# Patient Record
Sex: Male | Born: 1975
Health system: Southern US, Community
[De-identification: ages and names within clinical notes are randomized; demographics above are authoritative.]

## PROBLEM LIST (undated history)

## (undated) DIAGNOSIS — J45909 Unspecified asthma, uncomplicated: Secondary | ICD-10-CM

## (undated) DIAGNOSIS — N529 Male erectile dysfunction, unspecified: Secondary | ICD-10-CM

## (undated) DIAGNOSIS — E291 Testicular hypofunction: Secondary | ICD-10-CM

## (undated) HISTORY — DX: Testicular hypofunction: E29.1

## (undated) HISTORY — DX: Unspecified asthma, uncomplicated: J45.909

## (undated) HISTORY — DX: Male erectile dysfunction, unspecified: N52.9

---
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
## 2014-08-06 ENCOUNTER — Other Ambulatory Visit: Payer: Self-pay

## 2014-08-06 NOTE — Telephone Encounter (Signed)
Patient was last seen on 01/06/14 with a follow-up as needed. [facility] is [facility] [address] number is [phone].

## 2014-08-06 NOTE — Telephone Encounter (Signed)
Patient must be seen for this medicine Schedule appointment to see Malachy Mood for this please

## 2014-08-07 NOTE — Telephone Encounter (Signed)
Called and scheduled patient an appointment for Friday August 22, 2014 for med refill.

## 2014-08-12 ENCOUNTER — Telehealth: Payer: Self-pay | Admitting: Unknown Physician Specialty

## 2014-08-12 NOTE — Telephone Encounter (Signed)
E-Fax came through for refill: Rx: ALPRAZolam (XANAX) 0.5 MG tablet Copy in basket

## 2014-08-12 NOTE — Telephone Encounter (Signed)
Routing to provider. It seems like I just put in a refill request for this patient and medication within the last few days but I do not see in epic where anything has been done with it yet.

## 2014-08-13 NOTE — Telephone Encounter (Signed)
Steven Rowland is back tomorrow- OK to leave for her.

## 2014-08-14 MED ORDER — ALPRAZOLAM 0.5 MG PO TABS: 0.5000 mg | ORAL_TABLET | ORAL | Status: DC | PRN

## 2014-08-14 NOTE — Addendum Note (Signed)
Addended by: Kathrine Haddock on: 08/14/2014 08:52 AM   Modules accepted: Orders

## 2014-08-14 NOTE — Telephone Encounter (Signed)
Written but pt will need to pick up

## 2014-08-20 DIAGNOSIS — J45990 Exercise induced bronchospasm: Secondary | ICD-10-CM | POA: Insufficient documentation

## 2014-08-20 DIAGNOSIS — N529 Male erectile dysfunction, unspecified: Secondary | ICD-10-CM | POA: Insufficient documentation

## 2014-08-20 DIAGNOSIS — E291 Testicular hypofunction: Secondary | ICD-10-CM | POA: Insufficient documentation

## 2014-08-22 ENCOUNTER — Encounter: Payer: Self-pay | Admitting: Unknown Physician Specialty

## 2014-08-22 ENCOUNTER — Ambulatory Visit (INDEPENDENT_AMBULATORY_CARE_PROVIDER_SITE_OTHER): Payer: BLUE CROSS/BLUE SHIELD | Admitting: Unknown Physician Specialty

## 2014-08-22 VITALS — BP 126/83 | HR 57 | Temp 97.8°F | Ht 70.5 in | Wt 167.8 lb

## 2014-08-22 DIAGNOSIS — E291 Testicular hypofunction: Secondary | ICD-10-CM | POA: Diagnosis not present

## 2014-08-22 DIAGNOSIS — G4700 Insomnia, unspecified: Secondary | ICD-10-CM

## 2014-08-22 DIAGNOSIS — N529 Male erectile dysfunction, unspecified: Secondary | ICD-10-CM | POA: Diagnosis not present

## 2014-08-22 MED ORDER — TADALAFIL 20 MG PO TABS: 20.0000 mg | ORAL_TABLET | Freq: Every day | ORAL | Status: DC | PRN

## 2014-08-22 NOTE — Assessment & Plan Note (Signed)
Due to shift changes.  OK for Xanax twice a week

## 2014-08-22 NOTE — Assessment & Plan Note (Signed)
Through lifestyle

## 2014-08-22 NOTE — Assessment & Plan Note (Signed)
Rx for Cialis 

## 2014-08-22 NOTE — Progress Notes (Signed)
f  BP 126/83 mmHg  Pulse 57  Temp(Src) 97.8 F (36.6 C)  Ht 5' 10.5" (1.791 m)  Wt 167 lb 12.8 oz (76.114 kg)  BMI 23.73 kg/m2  SpO2 97%   Subjective:    Patient ID: Steven Rowland, male    DOB: [birth year], 39 y.o.   MRN: [id number]  HPI: Steven Rowland is a 39 y.o. male  Chief Complaint  Patient presents with  . Medication Refill    Pt needs refills on xanax and cialis   Anxiety Pt is taking Xanax about twice a week.  Pt finds that he needs this for shift changes.  This has been a long term medication without any changes in dosing.    ED Pt is off the Testosterone since going to the gym and getting fit.  Takes Cialis as he is often "under pressure" due to attempts to have a 4th child.    Relevant past medical, surgical, family and social history reviewed and updated as indicated. Interim medical history since our last visit reviewed. Allergies and medications reviewed and updated.  Review of Systems  Constitutional: Negative.   HENT: Negative.   Eyes: Negative.   Respiratory: Negative.   Cardiovascular: Negative.   Gastrointestinal: Negative.   Endocrine: Negative.   Genitourinary: Negative.   Skin: Negative.   Allergic/Immunologic: Negative.   Neurological: Negative.   Hematological: Negative.   Psychiatric/Behavioral: Negative.     Per HPI unless specifically indicated above     Objective:    BP 126/83 mmHg  Pulse 57  Temp(Src) 97.8 F (36.6 C)  Ht 5' 10.5" (1.791 m)  Wt 167 lb 12.8 oz (76.114 kg)  BMI 23.73 kg/m2  SpO2 97%  Wt Readings from Last 3 Encounters:  08/22/14 167 lb 12.8 oz (76.114 kg)  01/06/14 163 lb (73.936 kg)    Physical Exam  Constitutional: He is oriented to person, place, and time. He appears well-developed and well-nourished. No distress.  HENT:  Head: Normocephalic and atraumatic.  Eyes: Conjunctivae and lids are normal. Right eye exhibits no discharge. Left eye exhibits no discharge. No scleral icterus.  Cardiovascular:  Normal rate and regular rhythm.   Pulmonary/Chest: Effort normal and breath sounds normal. No respiratory distress.  Abdominal: Normal appearance. There is no splenomegaly or hepatomegaly.  Musculoskeletal: Normal range of motion.  Neurological: He is alert and oriented to person, place, and time.  Skin: Skin is intact. No rash noted. No pallor.  Psychiatric: He has a normal mood and affect. His behavior is normal. Judgment and thought content normal.    No results found for this or any previous visit.    Assessment & Plan:   Problem List Items Addressed This Visit      Unprioritized   Hypogonadism in male    Through lifestyle      ED (erectile dysfunction) - Primary    Rx for Cialis      Insomnia    Due to shift changes.  OK for Xanax twice a week          Follow up plan: Return in about 6 months (around 02/22/2015) for physical.

## 2015-02-24 ENCOUNTER — Encounter: Payer: Self-pay | Admitting: Unknown Physician Specialty

## 2015-02-24 ENCOUNTER — Ambulatory Visit (INDEPENDENT_AMBULATORY_CARE_PROVIDER_SITE_OTHER): Payer: BLUE CROSS/BLUE SHIELD | Admitting: Unknown Physician Specialty

## 2015-02-24 VITALS — BP 119/80 | HR 60 | Temp 97.4°F | Ht 69.5 in | Wt 172.2 lb

## 2015-02-24 DIAGNOSIS — G4700 Insomnia, unspecified: Secondary | ICD-10-CM | POA: Diagnosis not present

## 2015-02-24 DIAGNOSIS — E291 Testicular hypofunction: Secondary | ICD-10-CM | POA: Diagnosis not present

## 2015-02-24 DIAGNOSIS — Z0000 Encounter for general adult medical examination without abnormal findings: Secondary | ICD-10-CM | POA: Diagnosis not present

## 2015-02-24 MED ORDER — ALPRAZOLAM 0.5 MG PO TABS: 0.5000 mg | ORAL_TABLET | ORAL | Status: DC | PRN

## 2015-02-24 NOTE — Assessment & Plan Note (Signed)
Check levels.  Consider restarting after getting levels back.  Pt did best with injectables.

## 2015-02-24 NOTE — Assessment & Plan Note (Addendum)
Continue Alprazolam prn that he takes on nights he closes in the evening

## 2015-02-24 NOTE — Progress Notes (Signed)
BP 119/80 mmHg  Pulse 60  Temp(Src) 97.4 F (36.3 C)  Ht 5' 9.5" (1.765 m)  Wt 172 lb 3.2 oz (78.109 kg)  BMI 25.07 kg/m2  SpO2 97%   Subjective:    Patient ID: Steven Rowland, male    DOB: 06-20-1975, 40 y.o.   MRN: [id number]  HPI: Steven Rowland is a 40 y.o. male  Chief Complaint  Patient presents with  . Annual Exam    pt states he would like to start back on testosterone therapy   Hypogonadism History of hypogonadism.  He was on 300 mg every 2 weeks. Last follow up showed high levels and he stopped. Pt states he has no interest in sex.    Insomnia  Stable with Clonazepam.    Relevant past medical, surgical, family and social history reviewed and updated as indicated. Interim medical history since our last visit reviewed. Allergies and medications reviewed and updated.  Review of Systems  Constitutional: Negative.   HENT: Negative.   Eyes: Negative.   Respiratory: Negative.   Cardiovascular: Negative.   Gastrointestinal: Negative.   Endocrine: Negative.   Genitourinary: Negative.   Musculoskeletal:       H[place] and seeing Dr. Rogers Blocker.    Skin: Negative.   Allergic/Immunologic: Negative.   Neurological: Negative.   Hematological: Negative.   Psychiatric/Behavioral: Negative.     Per HPI unless specifically indicated above     Objective:    BP 119/80 mmHg  Pulse 60  Temp(Src) 97.4 F (36.3 C)  Ht 5' 9.5" (1.765 m)  Wt 172 lb 3.2 oz (78.109 kg)  BMI 25.07 kg/m2  SpO2 97%  Wt Readings from Last 3 Encounters:  02/24/15 172 lb 3.2 oz (78.109 kg)  08/22/14 167 lb 12.8 oz (76.114 kg)  01/06/14 163 lb (73.936 kg)    Physical Exam  Constitutional: He is oriented to person, place, and time. He appears well-developed and well-nourished.  HENT:  Head: Normocephalic.  Right Ear: Tympanic membrane, external ear and ear canal normal.  Left Ear: Tympanic membrane, external ear and ear canal normal.  Mouth/Throat: Uvula  is midline, oropharynx is clear and moist and mucous membranes are normal.  Eyes: Pupils are equal, round, and reactive to light.  Cardiovascular: Normal rate, regular rhythm and normal heart sounds.  Exam reveals no gallop and no friction rub.   No murmur heard. Pulmonary/Chest: Effort normal and breath sounds normal. No respiratory distress.  Abdominal: Soft. Bowel sounds are normal. He exhibits no distension. There is no tenderness.  Musculoskeletal: Normal range of motion.  Neurological: He is alert and oriented to person, place, and time. He has normal reflexes.  Skin: Skin is warm and dry.  Psychiatric: He has a normal mood and affect. His behavior is normal. Judgment and thought content normal.    No results found for this or any previous visit.    Assessment & Plan:   Problem List Items Addressed This Visit      Unprioritized   Hypogonadism in male    Check levels.  Consider restarting after getting levels back.  Pt did best with injectables.        Relevant Orders   PSA   Testosterone, Free, Total, SHBG   FSH   Prolactin   Insomnia    Continue Alprazolam prn that he takes on nights he closes in the evening       Other Visit Diagnoses    Routine general medical examination at  a health care facility    -  Primary    Relevant Orders    CBC    Lipid Panel w/o Chol/HDL Ratio    TSH    Comprehensive metabolic panel    HIV antibody        Follow up plan: Return in about 1 year (around 02/24/2016).

## 2015-02-25 ENCOUNTER — Other Ambulatory Visit: Payer: BLUE CROSS/BLUE SHIELD

## 2015-02-25 DIAGNOSIS — E291 Testicular hypofunction: Secondary | ICD-10-CM

## 2015-02-25 DIAGNOSIS — Z0000 Encounter for general adult medical examination without abnormal findings: Secondary | ICD-10-CM

## 2015-02-26 ENCOUNTER — Other Ambulatory Visit: Payer: Self-pay | Admitting: Unknown Physician Specialty

## 2015-02-26 ENCOUNTER — Other Ambulatory Visit: Payer: BLUE CROSS/BLUE SHIELD

## 2015-02-26 ENCOUNTER — Encounter: Payer: Self-pay | Admitting: Unknown Physician Specialty

## 2015-02-26 DIAGNOSIS — E291 Testicular hypofunction: Secondary | ICD-10-CM

## 2015-02-26 LAB — CBC
Hematocrit: 46.3 % (ref 37.5–51.0)
Hemoglobin: 16.2 g/dL (ref 12.6–17.7)
MCH: 29.7 pg (ref 26.6–33.0)
MCHC: 35 g/dL (ref 31.5–35.7)
MCV: 85 fL (ref 79–97)
PLATELETS: 236 10*3/uL (ref 150–379)
RBC: 5.46 x10E6/uL (ref 4.14–5.80)
RDW: 12.7 % (ref 12.3–15.4)
WBC: 5.1 10*3/uL (ref 3.4–10.8)

## 2015-02-26 LAB — COMPREHENSIVE METABOLIC PANEL
ALT: 32 IU/L (ref 0–44)
AST: 27 IU/L (ref 0–40)
Albumin/Globulin Ratio: 1.9 (ref 1.1–2.5)
Albumin: 4.5 g/dL (ref 3.5–5.5)
Alkaline Phosphatase: 56 IU/L (ref 39–117)
BUN/Creatinine Ratio: 22 — ABNORMAL HIGH (ref 8–19)
BUN: 22 mg/dL — AB (ref 6–20)
Bilirubin Total: 0.6 mg/dL (ref 0.0–1.2)
CALCIUM: 9.5 mg/dL (ref 8.7–10.2)
CO2: 25 mmol/L (ref 18–29)
Chloride: 101 mmol/L (ref 96–106)
Creatinine, Ser: 1.02 mg/dL (ref 0.76–1.27)
GFR, EST AFRICAN AMERICAN: 107 mL/min/{1.73_m2} (ref >59)
GFR, EST NON AFRICAN AMERICAN: 93 mL/min/{1.73_m2} (ref >59)
GLUCOSE: 80 mg/dL (ref 65–99)
Globulin, Total: 2.4 g/dL (ref 1.5–4.5)
Potassium: 4.5 mmol/L (ref 3.5–5.2)
Sodium: 139 mmol/L (ref 134–144)
TOTAL PROTEIN: 6.9 g/dL (ref 6.0–8.5)

## 2015-02-26 LAB — HIV ANTIBODY (ROUTINE TESTING W REFLEX): HIV Screen 4th Generation wRfx: NONREACTIVE

## 2015-02-26 LAB — FOLLICLE STIMULATING HORMONE: FSH: 2.2 m[IU]/mL (ref 1.5–12.4)

## 2015-02-26 LAB — TESTOSTERONE, FREE, TOTAL, SHBG
Sex Hormone Binding: 30.2 nmol/L (ref 16.5–55.9)
TESTOSTERONE FREE: 10 pg/mL (ref 8.7–25.1)
TESTOSTERONE: 332 ng/dL — AB (ref 348–1197)

## 2015-02-26 LAB — PSA: PROSTATE SPECIFIC AG, SERUM: 0.5 ng/mL (ref 0.0–4.0)

## 2015-02-26 LAB — LIPID PANEL W/O CHOL/HDL RATIO
Cholesterol, Total: 138 mg/dL (ref 100–199)
HDL: 41 mg/dL (ref >39)
LDL Calculated: 87 mg/dL (ref 0–99)
Triglycerides: 49 mg/dL (ref 0–149)
VLDL Cholesterol Cal: 10 mg/dL (ref 5–40)

## 2015-02-26 LAB — TSH: TSH: 1.72 u[IU]/mL (ref 0.450–4.500)

## 2015-02-26 LAB — PROLACTIN: Prolactin: 4 ng/mL (ref 4.0–15.2)

## 2015-03-04 ENCOUNTER — Encounter: Payer: Self-pay | Admitting: Urology

## 2015-03-04 ENCOUNTER — Other Ambulatory Visit: Payer: Self-pay

## 2015-03-04 ENCOUNTER — Ambulatory Visit (INDEPENDENT_AMBULATORY_CARE_PROVIDER_SITE_OTHER): Payer: BLUE CROSS/BLUE SHIELD | Admitting: Urology

## 2015-03-04 VITALS — BP 151/92 | HR 56 | Ht 70.0 in | Wt 176.9 lb

## 2015-03-04 DIAGNOSIS — E291 Testicular hypofunction: Secondary | ICD-10-CM

## 2015-03-04 DIAGNOSIS — F5221 Male erectile disorder: Secondary | ICD-10-CM

## 2015-03-04 NOTE — Progress Notes (Signed)
03/04/2015 11:09 AM   Steven Rowland [birth year] [id number]  Referring provider: Kathrine Haddock, NP [place], [place] [address]    HPI: Patient is a 40 year old Caucasian male with a history of hypogonadism who is referred by his PCP, Kathrine Haddock, NP to restart his testosterone therapy.    Patient had been injecting himself with testosterone cypionate for three years for hypogonadism.  He had been on topical gels prior to that.  He discontinued the gels due to transference issues with his small children.  He states he has a 9 year history of hypogonadism.  He had an undescended testicle in the 80's for which he underwent a successful orchiopexy.  He discontinued the injections in order to conceive another child.  They were unsuccessful, but he is having severe hypogonadal symptoms, such as loss of libido, depression, erections not as firm and falling asleep after dinner.  He would like to get back on some type of therapy.        Androgen Deficiency in the Aging Male      03/04/15 1000       Androgen Deficiency in the Aging Male   Do you have a decrease in libido (sex drive) Yes     Do you have lack of energy No     Do you have a decrease in strength and/or endurance No     Have you lost height No     Have you noticed a decreased "enjoyment of life" Yes     Are you sad and/or grumpy No     Are your erections less strong Yes     Have you noticed a recent deterioration in your ability to play sports No     Are you falling asleep after dinner Yes     Has there been a recent deterioration in your work performance No          When he was off his testosterone therapy, he was using PDE5-inhibitors to achieve erections.  When he is on testosterone therapy, his erections are strong and he has spontaneous erections as well.  His SHIM score today is 16, mild to moderate ED.  He does not have painful erections or curvature with his erections.        SHIM      03/04/15 1030       SHIM: Over the last 6 months:   How do you rate your confidence that you could get and keep an erection? Low     When you had erections with sexual stimulation, how often were your erections hard enough for penetration (entering your partner)? Sometimes (about half the time)     During sexual intercourse, how often were you able to maintain your erection after you had penetrated (entered) your partner? Slightly Difficult     During sexual intercourse, how difficult was it to maintain your erection to completion of intercourse? Slightly Difficult     When you attempted sexual intercourse, how often was it satisfactory for you? Difficult     SHIM Total Score   SHIM 16        Score: 1-7 Severe ED 8-11 Moderate ED 12-16 Mild-Moderate ED 17-21 Mild ED 22-25 No ED  He does not have any difficulty with urination.  His IPSS score 0/0.        IPSS      03/04/15 1000       International Prostate Symptom Score   How often have you  had the sensation of not emptying your bladder? Not at All     How often have you had to urinate less than every two hours? Not at All     How often have you found you stopped and started again several times when you urinated? Not at All     How often have you found it difficult to postpone urination? Not at All     How often have you had a weak urinary stream? Not at All     How often have you had to strain to start urination? Not at All     How many times did you typically get up at night to urinate? None     Total IPSS Score 0     Quality of Life due to urinary symptoms   If you were to spend the rest of your life with your urinary condition just the way it is now how would you feel about that? Delighted        Score:  1-7 Mild 8-19 Moderate 20-35 Severe  PMH: Past Medical History  Diagnosis Date  . Asthma   . Hypogonadism in male   . ED (erectile dysfunction)     Surgical History: Past Surgical History  Procedure Laterality Date  . Surgery  scrotal / testicular  1982    undescended testicle    Home Medications:    Medication List       This list is accurate as of: 03/04/15 11:09 AM.  Always use your most recent med list.               ALPRAZolam 0.5 MG tablet  Commonly known as:  XANAX  Take 1 tablet (0.5 mg total) by mouth as needed for anxiety. Take 1 tablet by mouth once daily as needed     aluminum chloride 20 % external solution  Commonly known as:  DRYSOL  Apply topically at bedtime. Reported on 03/04/2015     fluticasone 50 MCG/ACT nasal spray  Commonly known as:  FLONASE  Place 2 sprays into both nostrils daily. Reported on 03/04/2015        Allergies: No Known Allergies  Family History: Family History  Problem Relation Age of Onset  . Arthritis Mother   . Stroke Father   . Hyperlipidemia Father   . Hypertension Father   . Asthma Brother   . Thyroid disease Brother   . Eczema Son   . Cancer Maternal Grandmother   . Cancer Maternal Grandfather   . Kidney Stones Father   . Kidney disease Neg Hx   . Prostate cancer Neg Hx     Social History:  reports that he has never smoked. He has never used smokeless tobacco. He reports that he drinks about 0.6 - 1.2 oz of alcohol per week. He reports that he does not use illicit drugs.  ROS: UROLOGY Frequent Urination?: No Hard to postpone urination?: No Burning/pain with urination?: No Get up at night to urinate?: No Leakage of urine?: No Urine stream starts and stops?: No Trouble starting stream?: No Do you have to strain to urinate?: No Blood in urine?: No Urinary tract infection?: No Sexually transmitted disease?: No Injury to kidneys or bladder?: No Painful intercourse?: No Weak stream?: No Erection problems?: Yes Penile pain?: No  Gastrointestinal Nausea?: No Vomiting?: No Indigestion/heartburn?: No Diarrhea?: Yes Constipation?: No  Constitutional Fever: No Night sweats?: No Weight loss?: No Fatigue?: No  Skin Skin  rash/lesions?: No Itching?: No  Eyes  Blurred vision?: No Double vision?: No  Ears/Nose/Throat Sore throat?: No Sinus problems?: No  Hematologic/Lymphatic Swollen glands?: No Easy bruising?: No  Cardiovascular Leg swelling?: No Chest pain?: No  Respiratory Cough?: No Shortness of breath?: No  Endocrine Excessive thirst?: No  Musculoskeletal Back pain?: No Joint pain?: No  Neurological Headaches?: No Dizziness?: No  Psychologic Depression?: No Anxiety?: No  Physical Exam: BP 151/92 mmHg  Pulse 56  Ht 5\' 10"  (1.778 m)  Wt 176 lb 14.4 oz (80.241 kg)  BMI 25.38 kg/m2  Constitutional: Well nourished. Alert and oriented, No acute distress. HEENT: [place] AT, moist mucus membranes. Trachea midline, no masses. Cardiovascular: No clubbing, cyanosis, or edema. Respiratory: Normal respiratory effort, no increased work of breathing. GI: Abdomen is soft, non tender, non distended, no abdominal masses. Liver and spleen not palpable.  No hernias appreciated.  Stool sample for occult testing is not indicated.   GU: No CVA tenderness.  No bladder fullness or masses.  Patient with circumcised phallus.   Urethral meatus is patent.  No penile discharge. No penile lesions or rashes. Scrotum without lesions, cysts, rashes and/or edema.  Testicles are located scrotally bilaterally. No masses are appreciated in the testicles. Left and right epididymis are normal. Rectal: Patient with  normal sphincter tone. Anus and perineum without scarring or rashes. No rectal masses are appreciated. Prostate is approximately 35 grams, no nodules are appreciated. Seminal vesicles are normal. Skin: No rashes, bruises or suspicious lesions. Lymph: No cervical or inguinal adenopathy. Neurologic: Grossly intact, no focal deficits, moving all 4 extremities. Psychiatric: Normal mood and affect.  Laboratory Data: Lab Results  Component Value Date   WBC 5.1 02/25/2015   HCT 46.3 02/25/2015   MCV 85  02/25/2015   PLT 236 02/25/2015    Lab Results  Component Value Date   CREATININE 1.02 02/25/2015    Lab Results  Component Value Date   TESTOSTERONE 332* 02/25/2015    Lab Results  Component Value Date   TSH 1.720 02/25/2015    Lab Results  Component Value Date   AST 27 02/25/2015   Lab Results  Component Value Date   ALT 32 02/25/2015     Assessment & Plan:    1. Hypogonadism:   I explained to patient that hypogonadism is diagnosed after 2 morning serum testosterone draws before 9 AM on 2 separate occasions returned below the laboratories parameter for normal testosterone. He is in need of one more lab draw.    I discussed with the patient the side effects of testosterone therapy, such as: enlargement of the prostate gland that may in turn cause LUTS, possible increased risk of PCa, DVT's and/or PE's, possible increased risk of heart attack or stroke, lower sperm count, swelling of the ankles, feet, or body, with or without heart failure, enlarged or painful breasts, have problems breathing while you sleep (sleep apnea), increased prostate specific antigen, mood swings, hypertension and increased red blood cell count.  I also discussed that some men have had success using clomid for hypogonadism.  It does seem to be more successful in younger men, but there are incidences of good results in middle-aged men.  I explained that it is used in male infertility to stimulate the testicles to make more testosterone/sperm.  There has been no long term data on side effects, but some urologists has been having success with this medication.   He will return in the morning for a second testosterone draw before 9 AM.  He is interested  in starting the Clomid, if he is found to be hypogonadal.    2. Erectile dysfunction:   His SHIM score is 16.  He states his ED is not present when he is on testosterone therapy.  We will readdress once his testosterone values are at therapeutic levels.      Return for return tomorrow for an am testosterone draw.  These notes generated with voice recognition software. I apologize for typographical errors.  Zara Council, [facility] [address], [place], [place] [address] [phone]

## 2015-03-05 ENCOUNTER — Other Ambulatory Visit: Payer: BLUE CROSS/BLUE SHIELD

## 2015-03-05 DIAGNOSIS — E291 Testicular hypofunction: Secondary | ICD-10-CM

## 2015-03-06 ENCOUNTER — Telehealth: Payer: Self-pay

## 2015-03-06 ENCOUNTER — Ambulatory Visit: Payer: BLUE CROSS/BLUE SHIELD | Admitting: Obstetrics and Gynecology

## 2015-03-06 DIAGNOSIS — F5221 Male erectile disorder: Secondary | ICD-10-CM | POA: Insufficient documentation

## 2015-03-06 DIAGNOSIS — E291 Testicular hypofunction: Secondary | ICD-10-CM

## 2015-03-06 LAB — TESTOSTERONE: Testosterone: 296 ng/dL — ABNORMAL LOW (ref 348–1197)

## 2015-03-06 MED ORDER — CLOMIPHENE CITRATE 50 MG PO TABS: ORAL_TABLET | ORAL | Status: DC

## 2015-03-06 NOTE — Telephone Encounter (Signed)
-----   Message from Nori Riis, PA-C sent at 03/06/2015  8:24 AM EST ----- Patient's testosterone level is low.  He would like to try Clomid 50 mg, 1/2 tablet daily, # 30 with 3 refills.  We need to check his testosterone again in the morning in one month.

## 2015-03-06 NOTE — Telephone Encounter (Signed)
Spoke with pt in reference to testosterone. Pt agreed to clomid. Medication sent to pharmacy. Pt will RTC 04/02/15 for testosterone draw. Labs ordered.

## 2015-03-10 ENCOUNTER — Telehealth: Payer: Self-pay | Admitting: *Deleted

## 2015-03-10 NOTE — Telephone Encounter (Signed)
Spoke with patient regarding his Testopel approval. Patient is approved but has a 6000.00 deductible, so the insurance would not pay anything until that has been met, after that it would pay 25 %. Patient states he thinks the clomid is working and he will continue this at this time.

## 2015-03-24 ENCOUNTER — Telehealth: Payer: Self-pay | Admitting: Unknown Physician Specialty

## 2015-03-24 MED ORDER — SCOPOLAMINE 1 MG/3DAYS TD PT72: 1.0000 | MEDICATED_PATCH | TRANSDERMAL | Status: DC

## 2015-03-24 NOTE — Telephone Encounter (Signed)
Routing to provider  

## 2015-03-24 NOTE — Telephone Encounter (Signed)
Pt called and stated he is going on a 6 day cruise and he would like motion sickness patches. Send to [facility] [place].

## 2015-04-02 ENCOUNTER — Other Ambulatory Visit: Payer: BLUE CROSS/BLUE SHIELD

## 2015-04-02 DIAGNOSIS — E291 Testicular hypofunction: Secondary | ICD-10-CM

## 2015-04-03 LAB — TESTOSTERONE: Testosterone: 516 ng/dL (ref 348–1197)

## 2015-04-17 ENCOUNTER — Telehealth: Payer: Self-pay

## 2015-04-17 NOTE — Telephone Encounter (Signed)
LMOM-labs are great. Continue medication. Will need labs again in 3 mo.

## 2015-04-17 NOTE — Telephone Encounter (Signed)
-----   Message from Nori Riis, PA-C sent at 04/15/2015  9:16 AM EDT ----- Patient's testosterone level is great.  I would suggest continuing with the Clomid.  I will need a morning testosterone before 9 AM with a HCT in 3 months.

## 2015-10-14 ENCOUNTER — Other Ambulatory Visit: Payer: Self-pay | Admitting: Unknown Physician Specialty

## 2015-10-15 NOTE — Telephone Encounter (Signed)
Your patient 

## 2015-11-11 ENCOUNTER — Ambulatory Visit (INDEPENDENT_AMBULATORY_CARE_PROVIDER_SITE_OTHER): Payer: BLUE CROSS/BLUE SHIELD | Admitting: Unknown Physician Specialty

## 2015-11-11 ENCOUNTER — Encounter: Payer: Self-pay | Admitting: Unknown Physician Specialty

## 2015-11-11 VITALS — BP 126/86 | HR 61 | Temp 97.7°F | Wt 179.0 lb

## 2015-11-11 DIAGNOSIS — J0111 Acute recurrent frontal sinusitis: Secondary | ICD-10-CM

## 2015-11-11 DIAGNOSIS — J45909 Unspecified asthma, uncomplicated: Secondary | ICD-10-CM

## 2015-11-11 MED ORDER — AMOXICILLIN-POT CLAVULANATE 875-125 MG PO TABS: 1.0000 | ORAL_TABLET | Freq: Two times a day (BID) | ORAL | 0 refills | Status: DC

## 2015-11-11 MED ORDER — ALBUTEROL SULFATE HFA 108 (90 BASE) MCG/ACT IN AERS: 2.0000 | INHALATION_SPRAY | Freq: Four times a day (QID) | RESPIRATORY_TRACT | 2 refills | Status: DC | PRN

## 2015-11-11 MED ORDER — BENZONATATE 200 MG PO CAPS: 200.0000 mg | ORAL_CAPSULE | Freq: Two times a day (BID) | ORAL | 0 refills | Status: DC | PRN

## 2015-11-11 MED ORDER — FLUTICASONE PROPIONATE 50 MCG/ACT NA SUSP: 2.0000 | Freq: Every day | NASAL | 3 refills | Status: DC

## 2015-11-11 NOTE — Progress Notes (Signed)
BP 126/86 (BP Location: Left Arm, Patient Position: Sitting, Cuff Size: Large)   Pulse 61   Temp 97.7 F (36.5 C)   Wt 179 lb (81.2 kg)   SpO2 98%   BMI 25.68 kg/m    Subjective:    Patient ID: Steven Rowland, male    DOB: 05-Feb-1975, 40 y.o.   MRN: [id number]  HPI: Steven Rowland is a 40 y.o. male  Chief Complaint  Patient presents with  . URI    pt states he has a cough and chest congestion that started about 2 weeks ago. States it started in his head and has moved to his chest.    URI   This is a new problem. Episode onset: 2 weeks. The problem has been gradually worsening. There has been no fever. Associated symptoms include congestion, coughing and wheezing. Associated symptoms comments: SOB . He has tried decongestant and antihistamine for the symptoms.    Relevant past medical, surgical, family and social history reviewed and updated as indicated. Interim medical history since our last visit reviewed. Allergies and medications reviewed and updated.  Review of Systems  HENT: Positive for congestion.   Respiratory: Positive for cough and wheezing.     Per HPI unless specifically indicated above     Objective:    BP 126/86 (BP Location: Left Arm, Patient Position: Sitting, Cuff Size: Large)   Pulse 61   Temp 97.7 F (36.5 C)   Wt 179 lb (81.2 kg)   SpO2 98%   BMI 25.68 kg/m   Wt Readings from Last 3 Encounters:  11/11/15 179 lb (81.2 kg)  03/04/15 176 lb 14.4 oz (80.2 kg)  02/24/15 172 lb 3.2 oz (78.1 kg)    Physical Exam  Constitutional: He is oriented to person, place, and time. He appears well-developed and well-nourished. No distress.  HENT:  Head: Normocephalic and atraumatic.  Right Ear: Tympanic membrane and ear canal normal.  Left Ear: Tympanic membrane and ear canal normal.  Nose: Rhinorrhea present. Right sinus exhibits no maxillary sinus tenderness and no frontal sinus tenderness. Left sinus exhibits no maxillary sinus tenderness and no  frontal sinus tenderness.  Mouth/Throat: Uvula is midline. Posterior oropharyngeal edema present.  Eyes: Conjunctivae and lids are normal. Right eye exhibits no discharge. Left eye exhibits no discharge. No scleral icterus.  Neck: Normal range of motion. Neck supple. No JVD present. Carotid bruit is not present.  Cardiovascular: Normal rate, regular rhythm and normal heart sounds.   Pulmonary/Chest: Effort normal and breath sounds normal. No respiratory distress.  Abdominal: Normal appearance. There is no splenomegaly or hepatomegaly.  Musculoskeletal: Normal range of motion.  Neurological: He is alert and oriented to person, place, and time.  Skin: Skin is warm, dry and intact. No rash noted. No pallor.  Psychiatric: He has a normal mood and affect. His behavior is normal. Judgment and thought content normal.  Nursing note and vitals reviewed.   Results for orders placed or performed in visit on 04/02/15  Testosterone  Result Value Ref Range   Testosterone 516 348 - 1,197 ng/dL   Comment, Testosterone Comment       Assessment & Plan:   Problem List Items Addressed This Visit    None    Visit Diagnoses    Acute recurrent frontal sinusitis    -  Primary   Relevant Medications   amoxicillin-clavulanate (AUGMENTIN) 875-125 MG tablet   fluticasone (FLONASE) 50 MCG/ACT nasal spray   benzonatate (TESSALON) 200 MG capsule  Mild reactive airways disease, unspecified whether persistent           Follow up plan: Return if symptoms worsen or fail to improve.

## 2015-12-08 ENCOUNTER — Encounter: Payer: Self-pay | Admitting: Unknown Physician Specialty

## 2015-12-08 ENCOUNTER — Ambulatory Visit (INDEPENDENT_AMBULATORY_CARE_PROVIDER_SITE_OTHER): Payer: BLUE CROSS/BLUE SHIELD | Admitting: Unknown Physician Specialty

## 2015-12-08 VITALS — BP 148/89 | HR 91 | Temp 102.8°F | Wt 181.8 lb

## 2015-12-08 DIAGNOSIS — R509 Fever, unspecified: Secondary | ICD-10-CM | POA: Diagnosis not present

## 2015-12-08 DIAGNOSIS — J020 Streptococcal pharyngitis: Secondary | ICD-10-CM

## 2015-12-08 DIAGNOSIS — J029 Acute pharyngitis, unspecified: Secondary | ICD-10-CM

## 2015-12-08 LAB — VERITOR FLU A/B WAIVED
INFLUENZA B: NEGATIVE
Influenza A: NEGATIVE

## 2015-12-08 LAB — RAPID STREP SCREEN (MED CTR MEBANE ONLY): Strep Gp A Ag, IA W/Reflex: POSITIVE — AB

## 2015-12-08 MED ORDER — AMOXICILLIN 875 MG PO TABS: 875.0000 mg | ORAL_TABLET | Freq: Two times a day (BID) | ORAL | 0 refills | Status: DC

## 2015-12-08 NOTE — Progress Notes (Signed)
BP (!) 148/89 (BP Location: Left Arm, Patient Position: Sitting, Cuff Size: Normal)   Pulse 91   Temp (!) 102.8 F (39.3 C)   Wt 181 lb 12.8 oz (82.5 kg)   SpO2 97%   BMI 26.09 kg/m    Subjective:    Patient ID: Steven Rowland, male    DOB: 02/20/1975, 40 y.o.   MRN: [id number]  HPI: Brentyn Skoog is a 40 y.o. male  Chief Complaint  Patient presents with  . URI    pt states he has a sore throat, chills, fever, weakness, and body aches. States symptoms started yesterday.    URI   This is a new problem. The current episode started yesterday. The problem has been gradually worsening. The maximum temperature recorded prior to his arrival was 102 - 102.9 F. Associated symptoms include coughing and a sore throat. He has tried NSAIDs for the symptoms. The treatment provided mild relief.    Relevant past medical, surgical, family and social history reviewed and updated as indicated. Interim medical history since our last visit reviewed. Allergies and medications reviewed and updated.  Review of Systems  HENT: Positive for sore throat.   Respiratory: Positive for cough.     Per HPI unless specifically indicated above     Objective:    BP (!) 148/89 (BP Location: Left Arm, Patient Position: Sitting, Cuff Size: Normal)   Pulse 91   Temp (!) 102.8 F (39.3 C)   Wt 181 lb 12.8 oz (82.5 kg)   SpO2 97%   BMI 26.09 kg/m   Wt Readings from Last 3 Encounters:  12/08/15 181 lb 12.8 oz (82.5 kg)  11/11/15 179 lb (81.2 kg)  03/04/15 176 lb 14.4 oz (80.2 kg)    Physical Exam  Constitutional: He is oriented to person, place, and time. He appears well-developed and well-nourished. No distress.  HENT:  Head: Normocephalic and atraumatic.  Right Ear: Tympanic membrane and ear canal normal.  Left Ear: Tympanic membrane and ear canal normal.  Nose: Rhinorrhea present. Right sinus exhibits no maxillary sinus tenderness and no frontal sinus tenderness. Left sinus exhibits no  maxillary sinus tenderness and no frontal sinus tenderness.  Mouth/Throat: Uvula is midline. Oropharyngeal exudate and posterior oropharyngeal edema present. No tonsillar abscesses.  Eyes: Conjunctivae and lids are normal. Right eye exhibits no discharge. Left eye exhibits no discharge. No scleral icterus.  Neck: Neck supple.  Cardiovascular: Normal rate, regular rhythm and normal heart sounds.   Pulmonary/Chest: Effort normal and breath sounds normal. No respiratory distress.  Abdominal: Normal appearance. There is no splenomegaly or hepatomegaly.  Musculoskeletal: Normal range of motion.  Neurological: He is alert and oriented to person, place, and time.  Skin: Skin is warm, dry and intact. No rash noted. No pallor.  Psychiatric: He has a normal mood and affect. His behavior is normal. Judgment and thought content normal.  Nursing note and vitals reviewed.   Results for orders placed or performed in visit on 04/02/15  Testosterone  Result Value Ref Range   Testosterone 516 348 - 1,197 ng/dL   Comment, Testosterone Comment       Assessment & Plan:   Problem List Items Addressed This Visit    None    Visit Diagnoses    Fever, unspecified fever cause    -  Primary   Relevant Orders   Veritor Flu A/B Waived   Sore throat       Relevant Orders   Rapid strep screen (not at  [facility])   Strep pharyngitis       Rx for Amoxicillin       Follow up plan: Return if symptoms worsen or fail to improve.

## 2016-02-04 ENCOUNTER — Other Ambulatory Visit: Payer: Self-pay | Admitting: Urology

## 2016-02-04 DIAGNOSIS — E291 Testicular hypofunction: Secondary | ICD-10-CM

## 2016-02-05 NOTE — Telephone Encounter (Signed)
Pt pharmacy sent a refill request of clomid. Medication was denied. Pt needs labs and OV prior to refill. Spoke with pt and made aware of needing labs and OV. Pt voiced understanding. Lab appt made with orders placed. Pt stated he will make an appt at lab visit.

## 2016-02-10 ENCOUNTER — Other Ambulatory Visit: Payer: Self-pay

## 2016-02-10 ENCOUNTER — Encounter: Payer: Self-pay | Admitting: Urology

## 2016-02-26 ENCOUNTER — Encounter: Payer: BLUE CROSS/BLUE SHIELD | Admitting: Unknown Physician Specialty

## 2016-03-16 ENCOUNTER — Encounter: Payer: Self-pay | Admitting: Unknown Physician Specialty

## 2016-03-16 ENCOUNTER — Telehealth: Payer: Self-pay | Admitting: Unknown Physician Specialty

## 2016-03-16 ENCOUNTER — Ambulatory Visit (INDEPENDENT_AMBULATORY_CARE_PROVIDER_SITE_OTHER): Payer: BLUE CROSS/BLUE SHIELD | Admitting: Unknown Physician Specialty

## 2016-03-16 VITALS — BP 133/82 | HR 61 | Temp 97.9°F | Ht 69.2 in | Wt 176.7 lb

## 2016-03-16 DIAGNOSIS — E291 Testicular hypofunction: Secondary | ICD-10-CM | POA: Diagnosis not present

## 2016-03-16 DIAGNOSIS — Z0000 Encounter for general adult medical examination without abnormal findings: Secondary | ICD-10-CM

## 2016-03-16 DIAGNOSIS — F5101 Primary insomnia: Secondary | ICD-10-CM

## 2016-03-16 NOTE — Assessment & Plan Note (Signed)
Controlled with present meds

## 2016-03-16 NOTE — Assessment & Plan Note (Signed)
F/u with urology.  If there is a protocol, I can follow

## 2016-03-16 NOTE — Progress Notes (Signed)
   BP 133/82 (BP Location: Left Arm, Cuff Size: Large)   Pulse 61   Temp 97.9 F (36.6 C)   Ht 5' 9.2" (1.758 m)   Wt 176 lb 11.2 oz (80.2 kg)   SpO2 100%   BMI 25.94 kg/m    Subjective:    Patient ID: Steven Rowland, male    DOB: 09/22/1975, 41 y.o.   MRN: [id number]  HPI: Steven Rowland is a 41 y.o. male  Chief Complaint  Patient presents with  . 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  He still has refills left.    Hypogonadism Taking Clomid but out for the last month.  States it is working "okay."  Relevant past medical, surgical, family and social history reviewed and updated as indicated. Interim medical history since our last visit reviewed. Allergies and medications reviewed and updated.  Review of Systems  Constitutional: Negative.   HENT: Negative.   Eyes: Negative.   Respiratory: Negative.   Cardiovascular: Negative.   Gastrointestinal: Negative.   Endocrine: Negative.   Genitourinary: Negative.   Skin: Negative.   Allergic/Immunologic: Negative.   Neurological: Negative.   Hematological: Negative.   Psychiatric/Behavioral: Negative.     Per HPI unless specifically indicated above     Objective:    BP 133/82 (BP Location: Left Arm, Cuff Size: Large)   Pulse 61   Temp 97.9 F (36.6 C)   Ht 5' 9.2" (1.758 m)   Wt 176 lb 11.2 oz (80.2 kg)   SpO2 100%   BMI 25.94 kg/m   Wt Readings from Last 3 Encounters:  03/16/16 176 lb 11.2 oz (80.2 kg)  12/08/15 181 lb 12.8 oz (82.5 kg)  11/11/15 179 lb (81.2 kg)    Physical Exam  Constitutional: He is oriented to person, place, and time. He appears well-developed and well-nourished.  HENT:  Head: Normocephalic.  Right Ear: Tympanic membrane, external ear and ear canal normal.  Left Ear: Tympanic membrane, external ear and ear canal normal.  Mouth/Throat: Uvula is midline, oropharynx is clear and moist and mucous membranes are normal.  Eyes: Pupils are  equal, round, and reactive to light.  Cardiovascular: Normal rate, regular rhythm and normal heart sounds.  Exam reveals no gallop and no friction rub.   No murmur heard. Pulmonary/Chest: Effort normal and breath sounds normal. No respiratory distress.  Abdominal: Soft. Bowel sounds are normal. He exhibits no distension. There is no tenderness.  Musculoskeletal: Normal range of motion.  Neurological: He is alert and oriented to person, place, and time. He has normal reflexes.  Skin: Skin is warm and dry.  Psychiatric: He has a normal mood and affect. His behavior is normal. Judgment and thought content normal.     Assessment & Plan:   Problem List Items Addressed This Visit      Unprioritized   Hypogonadism in male    F/u with urology.  If there is a protocol, I can follow      Insomnia    Controlled with present meds       Other Visit Diagnoses    Routine general medical examination at a health care facility    -  Primary   Relevant Orders   Comprehensive metabolic panel   CBC with Differential/Platelet   Lipid Panel w/o Chol/HDL Ratio   TSH       Follow up plan: Return in about 1 year (around 03/16/2017).

## 2016-03-17 LAB — CBC WITH DIFFERENTIAL/PLATELET
Basophils Absolute: 0 10*3/uL (ref 0.0–0.2)
Basos: 1 %
EOS (ABSOLUTE): 0.2 10*3/uL (ref 0.0–0.4)
EOS: 4 %
HEMATOCRIT: 43.4 % (ref 37.5–51.0)
HEMOGLOBIN: 15.2 g/dL (ref 13.0–17.7)
Immature Grans (Abs): 0 10*3/uL (ref 0.0–0.1)
Immature Granulocytes: 0 %
LYMPHS ABS: 1.3 10*3/uL (ref 0.7–3.1)
LYMPHS: 34 %
MCH: 29.2 pg (ref 26.6–33.0)
MCHC: 35 g/dL (ref 31.5–35.7)
MCV: 83 fL (ref 79–97)
MONOCYTES: 9 %
Monocytes Absolute: 0.4 10*3/uL (ref 0.1–0.9)
NEUTROS PCT: 52 %
Neutrophils Absolute: 2.1 10*3/uL (ref 1.4–7.0)
Platelets: 192 10*3/uL (ref 150–379)
RBC: 5.21 x10E6/uL (ref 4.14–5.80)
RDW: 13 % (ref 12.3–15.4)
WBC: 4 10*3/uL (ref 3.4–10.8)

## 2016-03-17 LAB — COMPREHENSIVE METABOLIC PANEL
ALBUMIN: 4.3 g/dL (ref 3.5–5.5)
ALK PHOS: 53 IU/L (ref 39–117)
ALT: 20 IU/L (ref 0–44)
AST: 24 IU/L (ref 0–40)
Albumin/Globulin Ratio: 1.7 (ref 1.2–2.2)
BILIRUBIN TOTAL: 0.6 mg/dL (ref 0.0–1.2)
BUN / CREAT RATIO: 24 — AB (ref 9–20)
BUN: 22 mg/dL — AB (ref 6–20)
CHLORIDE: 100 mmol/L (ref 96–106)
CO2: 19 mmol/L (ref 18–29)
CREATININE: 0.9 mg/dL (ref 0.76–1.27)
Calcium: 9.3 mg/dL (ref 8.7–10.2)
GFR calc Af Amer: 124 mL/min/{1.73_m2} (ref >59)
GFR calc non Af Amer: 107 mL/min/{1.73_m2} (ref >59)
GLUCOSE: 84 mg/dL (ref 65–99)
Globulin, Total: 2.5 g/dL (ref 1.5–4.5)
Potassium: 4.5 mmol/L (ref 3.5–5.2)
Sodium: 137 mmol/L (ref 134–144)
Total Protein: 6.8 g/dL (ref 6.0–8.5)

## 2016-03-17 LAB — LIPID PANEL W/O CHOL/HDL RATIO
CHOLESTEROL TOTAL: 132 mg/dL (ref 100–199)
HDL: 38 mg/dL — ABNORMAL LOW (ref >39)
LDL CALC: 83 mg/dL (ref 0–99)
TRIGLYCERIDES: 54 mg/dL (ref 0–149)
VLDL Cholesterol Cal: 11 mg/dL (ref 5–40)

## 2016-03-17 LAB — TSH: TSH: 1.82 u[IU]/mL (ref 0.450–4.500)

## 2016-03-17 LAB — PLEASE NOTE

## 2016-03-21 ENCOUNTER — Encounter: Payer: Self-pay | Admitting: Unknown Physician Specialty

## 2016-03-29 NOTE — Telephone Encounter (Signed)
error 

## 2016-06-17 ENCOUNTER — Other Ambulatory Visit: Payer: Self-pay | Admitting: Unknown Physician Specialty

## 2016-06-20 ENCOUNTER — Other Ambulatory Visit: Payer: Self-pay | Admitting: Unknown Physician Specialty

## 2016-07-17 ENCOUNTER — Other Ambulatory Visit: Payer: Self-pay | Admitting: Unknown Physician Specialty

## 2016-09-17 ENCOUNTER — Other Ambulatory Visit: Payer: Self-pay | Admitting: Family Medicine

## 2016-09-19 NOTE — Telephone Encounter (Signed)
Your patient 

## 2016-11-03 ENCOUNTER — Other Ambulatory Visit: Payer: Self-pay | Admitting: Unknown Physician Specialty

## 2017-03-17 ENCOUNTER — Encounter: Payer: BLUE CROSS/BLUE SHIELD | Admitting: Unknown Physician Specialty

## 2017-03-21 ENCOUNTER — Other Ambulatory Visit: Payer: Self-pay | Admitting: Unknown Physician Specialty

## 2017-04-03 ENCOUNTER — Telehealth: Payer: Self-pay | Admitting: Unknown Physician Specialty

## 2017-04-03 ENCOUNTER — Encounter: Payer: BLUE CROSS/BLUE SHIELD | Admitting: Unknown Physician Specialty

## 2017-04-03 DIAGNOSIS — Z0000 Encounter for general adult medical examination without abnormal findings: Secondary | ICD-10-CM

## 2017-04-03 NOTE — Telephone Encounter (Signed)
Copied from [place] [id number]. Topic: Inquiry >> Mar 28, 2017 10:42 AM Oliver Pila B wrote: Reason for CRM: pt has a cpe on the 20th of march and is wanting fasting labs for the visit  >> Apr 03, 2017  2:11 PM Georgina Peer, [place] wrote: Routing to provider. Does patient need to wait until appointment for labs? If not, please enter labs you would like done as future orders.

## 2017-04-19 ENCOUNTER — Ambulatory Visit (INDEPENDENT_AMBULATORY_CARE_PROVIDER_SITE_OTHER): Payer: BLUE CROSS/BLUE SHIELD | Admitting: Unknown Physician Specialty

## 2017-04-19 ENCOUNTER — Encounter: Payer: Self-pay | Admitting: Unknown Physician Specialty

## 2017-04-19 DIAGNOSIS — E291 Testicular hypofunction: Secondary | ICD-10-CM

## 2017-04-19 DIAGNOSIS — Z0001 Encounter for general adult medical examination with abnormal findings: Secondary | ICD-10-CM | POA: Diagnosis not present

## 2017-04-19 DIAGNOSIS — F5101 Primary insomnia: Secondary | ICD-10-CM | POA: Diagnosis not present

## 2017-04-19 DIAGNOSIS — Z0000 Encounter for general adult medical examination without abnormal findings: Secondary | ICD-10-CM

## 2017-04-19 DIAGNOSIS — N529 Male erectile dysfunction, unspecified: Secondary | ICD-10-CM | POA: Diagnosis not present

## 2017-04-19 MED ORDER — SILDENAFIL CITRATE 20 MG PO TABS: ORAL_TABLET | ORAL | 1 refills | Status: DC

## 2017-04-19 MED ORDER — ALPRAZOLAM 0.5 MG PO TABS: ORAL_TABLET | ORAL | 0 refills | Status: DC

## 2017-04-19 MED ORDER — FINASTERIDE 1 MG PO TABS: 1.0000 mg | ORAL_TABLET | Freq: Every day | ORAL | 0 refills | Status: DC

## 2017-04-19 NOTE — Assessment & Plan Note (Signed)
Continue occasional Xanax with shift work

## 2017-04-19 NOTE — Assessment & Plan Note (Signed)
Check Testosterone levels. 

## 2017-04-19 NOTE — Progress Notes (Signed)
BP 122/83   Pulse (!) 41   Temp 98.3 F (36.8 C) (Oral)   Ht 5\' 10"  (1.778 m)   Wt 163 lb 4.8 oz (74.1 kg)   SpO2 98%   BMI 23.43 kg/m    Subjective:    Patient ID: Steven Rowland, male    DOB: 03/08/1975, 42 y.o.   MRN: [id number]  HPI: Steven Rowland is a 42 y.o. male  Chief Complaint  Patient presents with  . 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 This is about once or twice a week.    Hypogonadism Focused on exercising and increasing body mass.    Social History   Socioeconomic History  . Marital status: Married    Spouse name: Not on file  . Number of children: Not on file  . Years of education: Not on file  . Highest education level: Not on file  Social Needs  . Financial resource strain: Not on file  . Food insecurity - worry: Not on file  . Food insecurity - inability: Not on file  . Transportation needs - medical: Not on file  . Transportation needs - non-medical: Not on file  Occupational History  . Not on file  Tobacco Use  . Smoking status: Never Smoker  . Smokeless tobacco: Never Used  Substance and Sexual Activity  . Alcohol use: Yes    Alcohol/week: 0.6 - 1.2 oz    Types: 1 - 2 Cans of beer per week  . Drug use: No  . Sexual activity: Yes  Other Topics Concern  . Not on file  Social History Narrative  . Not on file   Family History  Problem Relation Age of Onset  . Arthritis Mother   . Stroke Father   . Hyperlipidemia Father   . Hypertension Father   . Kidney Stones Father   . Asthma Brother   . Thyroid disease Brother   . Eczema Son   . Cancer Maternal Grandmother   . Cancer Maternal Grandfather   . Kidney disease Neg Hx   . Prostate cancer Neg Hx    Past Medical History:  Diagnosis Date  . Asthma   . ED (erectile dysfunction)   . Hypogonadism in male    Past Surgical History:  Procedure Laterality Date  . SURGERY SCROTAL / TESTICULAR  1982   undescended testicle    Relevant past medical, surgical, family and social history reviewed and updated as indicated. Interim medical history since our last visit reviewed. Allergies and medications reviewed and updated.  Review of Systems  Constitutional: Negative.   HENT: Negative.   Eyes: Negative.   Respiratory: Negative.   Cardiovascular: Negative.   Gastrointestinal: Negative.   Endocrine: Negative.   Genitourinary: Negative.   Skin: Negative.   Allergic/Immunologic: Negative.   Neurological: Negative.   Hematological: Negative.   Psychiatric/Behavioral: Negative.     Per HPI unless specifically indicated above     Objective:    BP 122/83   Pulse (!) 41   Temp 98.3 F (36.8 C) (Oral)   Ht 5\' 10"  (1.778 m)   Wt 163 lb 4.8 oz (74.1 kg)   SpO2 98%   BMI 23.43 kg/m   Wt Readings from Last 3 Encounters:  04/19/17 163 lb 4.8 oz (74.1 kg)  03/16/16 176 lb 11.2 oz (80.2 kg)  12/08/15 181 lb 12.8 oz (82.5 kg)    Physical Exam  Constitutional: He is oriented to person, place,  and time. He appears well-developed and well-nourished.  HENT:  Head: Normocephalic.  Right Ear: Tympanic membrane, external ear and ear canal normal.  Left Ear: Tympanic membrane, external ear and ear canal normal.  Mouth/Throat: Uvula is midline, oropharynx is clear and moist and mucous membranes are normal.  Eyes: Pupils are equal, round, and reactive to light.  Cardiovascular: Normal rate, regular rhythm and normal heart sounds. Exam reveals no gallop and no friction rub.  No murmur heard. Pulmonary/Chest: Effort normal and breath sounds normal. No respiratory distress.  Abdominal: Soft. Bowel sounds are normal. He exhibits no distension. There is no tenderness.  Musculoskeletal: Normal range of motion.  Neurological: He is alert and oriented to person, place, and time. He has normal reflexes.  Skin: Skin is warm and dry.  Psychiatric: He has a normal mood and affect. His behavior is normal. Judgment and thought  content normal.    Results for orders placed or performed in visit on 03/16/16  Comprehensive metabolic panel  Result Value Ref Range   Glucose 84 65 - 99 mg/dL   BUN 22 (H) 6 - 20 mg/dL   Creatinine, Ser 0.90 0.76 - 1.27 mg/dL   GFR calc non Af Amer 107 >59 mL/min/1.73   GFR calc Af Amer 124 >59 mL/min/1.73   BUN/Creatinine Ratio 24 (H) 9 - 20   Sodium 137 134 - 144 mmol/L   Potassium 4.5 3.5 - 5.2 mmol/L   Chloride 100 96 - 106 mmol/L   CO2 19 18 - 29 mmol/L   Calcium 9.3 8.7 - 10.2 mg/dL   Total Protein 6.8 6.0 - 8.5 g/dL   Albumin 4.3 3.5 - 5.5 g/dL   Globulin, Total 2.5 1.5 - 4.5 g/dL   Albumin/Globulin Ratio 1.7 1.2 - 2.2   Bilirubin Total 0.6 0.0 - 1.2 mg/dL   Alkaline Phosphatase 53 39 - 117 IU/L   AST 24 0 - 40 IU/L   ALT 20 0 - 44 IU/L  CBC with Differential/Platelet  Result Value Ref Range   WBC 4.0 3.4 - 10.8 x10E3/uL   RBC 5.21 4.14 - 5.80 x10E6/uL   Hemoglobin 15.2 13.0 - 17.7 g/dL   Hematocrit 43.4 37.5 - 51.0 %   MCV 83 79 - 97 fL   MCH 29.2 26.6 - 33.0 pg   MCHC 35.0 31.5 - 35.7 g/dL   RDW 13.0 12.3 - 15.4 %   Platelets 192 150 - 379 x10E3/uL   Neutrophils 52 Not Estab. %   Lymphs 34 Not Estab. %   Monocytes 9 Not Estab. %   Eos 4 Not Estab. %   Basos 1 Not Estab. %   Neutrophils Absolute 2.1 1.4 - 7.0 x10E3/uL   Lymphocytes Absolute 1.3 0.7 - 3.1 x10E3/uL   Monocytes Absolute 0.4 0.1 - 0.9 x10E3/uL   EOS (ABSOLUTE) 0.2 0.0 - 0.4 x10E3/uL   Basophils Absolute 0.0 0.0 - 0.2 x10E3/uL   Immature Granulocytes 0 Not Estab. %   Immature Grans (Abs) 0.0 0.0 - 0.1 x10E3/uL  Lipid Panel w/o Chol/HDL Ratio  Result Value Ref Range   Cholesterol, Total 132 100 - 199 mg/dL   Triglycerides 54 0 - 149 mg/dL   HDL 38 (L) >39 mg/dL   VLDL Cholesterol Cal 11 5 - 40 mg/dL   LDL Calculated 83 0 - 99 mg/dL  TSH  Result Value Ref Range   TSH 1.820 0.450 - 4.500 uIU/mL  Please Note  Result Value Ref Range   Please note Comment  Assessment & Plan:    Problem List Items Addressed This Visit      Unprioritized   ED (erectile dysfunction)    OK for occasional Sildenafil      Hypogonadism in male    Check Testosterone levels      Insomnia    Continue occasional Xanax with shift work       Other Visit Diagnoses    Routine general medical examination at a health care facility          Td due 2026  Follow up plan: Return in about 1 year (around 04/20/2018).

## 2017-04-19 NOTE — Assessment & Plan Note (Addendum)
Occasional problem.  No real change.  OK for occasional Sildenafil

## 2017-04-20 ENCOUNTER — Encounter: Payer: Self-pay | Admitting: Unknown Physician Specialty

## 2017-04-21 LAB — COMPREHENSIVE METABOLIC PANEL
ALT: 18 IU/L (ref 0–44)
AST: 18 IU/L (ref 0–40)
Albumin/Globulin Ratio: 1.8 (ref 1.2–2.2)
Albumin: 4.2 g/dL (ref 3.5–5.5)
Alkaline Phosphatase: 52 IU/L (ref 39–117)
BILIRUBIN TOTAL: 0.7 mg/dL (ref 0.0–1.2)
BUN/Creatinine Ratio: 21 — ABNORMAL HIGH (ref 9–20)
BUN: 17 mg/dL (ref 6–24)
CO2: 24 mmol/L (ref 20–29)
Calcium: 9.2 mg/dL (ref 8.7–10.2)
Chloride: 104 mmol/L (ref 96–106)
Creatinine, Ser: 0.82 mg/dL (ref 0.76–1.27)
GFR calc Af Amer: 128 mL/min/{1.73_m2} (ref >59)
GFR calc non Af Amer: 111 mL/min/{1.73_m2} (ref >59)
GLOBULIN, TOTAL: 2.4 g/dL (ref 1.5–4.5)
Glucose: 85 mg/dL (ref 65–99)
Potassium: 4.6 mmol/L (ref 3.5–5.2)
SODIUM: 141 mmol/L (ref 134–144)
Total Protein: 6.6 g/dL (ref 6.0–8.5)

## 2017-04-21 LAB — TESTOSTERONE, FREE, TOTAL, SHBG
SEX HORMONE BINDING: 43.6 nmol/L (ref 16.5–55.9)
Testosterone, Free: 11.2 pg/mL (ref 6.8–21.5)
Testosterone: 569 ng/dL (ref 264–916)

## 2017-04-21 LAB — LIPID PANEL W/O CHOL/HDL RATIO
Cholesterol, Total: 134 mg/dL (ref 100–199)
HDL: 52 mg/dL (ref >39)
LDL Calculated: 70 mg/dL (ref 0–99)
Triglycerides: 58 mg/dL (ref 0–149)
VLDL CHOLESTEROL CAL: 12 mg/dL (ref 5–40)

## 2017-04-21 LAB — CBC WITH DIFFERENTIAL/PLATELET
BASOS ABS: 0 10*3/uL (ref 0.0–0.2)
Basos: 1 %
EOS (ABSOLUTE): 0.1 10*3/uL (ref 0.0–0.4)
Eos: 3 %
HEMOGLOBIN: 15.5 g/dL (ref 13.0–17.7)
Hematocrit: 44.3 % (ref 37.5–51.0)
IMMATURE GRANS (ABS): 0 10*3/uL (ref 0.0–0.1)
Immature Granulocytes: 0 %
LYMPHS: 37 %
Lymphocytes Absolute: 1.3 10*3/uL (ref 0.7–3.1)
MCH: 30 pg (ref 26.6–33.0)
MCHC: 35 g/dL (ref 31.5–35.7)
MCV: 86 fL (ref 79–97)
MONOCYTES: 11 %
Monocytes Absolute: 0.4 10*3/uL (ref 0.1–0.9)
NEUTROS ABS: 1.7 10*3/uL (ref 1.4–7.0)
Neutrophils: 48 %
Platelets: 216 10*3/uL (ref 150–379)
RBC: 5.16 x10E6/uL (ref 4.14–5.80)
RDW: 12.8 % (ref 12.3–15.4)
WBC: 3.6 10*3/uL (ref 3.4–10.8)

## 2017-04-21 LAB — TSH: TSH: 1.44 u[IU]/mL (ref 0.450–4.500)

## 2017-07-03 ENCOUNTER — Other Ambulatory Visit: Payer: Self-pay | Admitting: Unknown Physician Specialty

## 2017-07-05 NOTE — Telephone Encounter (Signed)
Refill req. On Alprazolam; last refill 04/19/17; #30; no refills Last office visit:  04/19/17 PCP Kathrine Haddock Pharm:  [facility] in [place]; [place].

## 2017-08-09 ENCOUNTER — Other Ambulatory Visit: Payer: Self-pay | Admitting: Unknown Physician Specialty

## 2018-02-07 ENCOUNTER — Other Ambulatory Visit: Payer: Self-pay | Admitting: Unknown Physician Specialty

## 2018-02-16 ENCOUNTER — Ambulatory Visit (INDEPENDENT_AMBULATORY_CARE_PROVIDER_SITE_OTHER): Payer: 59 | Admitting: Unknown Physician Specialty

## 2018-02-16 ENCOUNTER — Encounter: Payer: Self-pay | Admitting: Unknown Physician Specialty

## 2018-02-16 VITALS — BP 127/82 | HR 51 | Temp 98.0°F | Ht 69.02 in | Wt 168.4 lb

## 2018-02-16 DIAGNOSIS — Z0000 Encounter for general adult medical examination without abnormal findings: Secondary | ICD-10-CM | POA: Diagnosis not present

## 2018-02-16 DIAGNOSIS — Z125 Encounter for screening for malignant neoplasm of prostate: Secondary | ICD-10-CM

## 2018-02-16 MED ORDER — FINASTERIDE 1 MG PO TABS: 1.0000 mg | ORAL_TABLET | Freq: Every day | ORAL | 3 refills | Status: DC

## 2018-02-16 MED ORDER — ALPRAZOLAM 0.5 MG PO TABS: ORAL_TABLET | ORAL | 0 refills | Status: DC

## 2018-02-16 MED ORDER — SILDENAFIL CITRATE 20 MG PO TABS: ORAL_TABLET | ORAL | 1 refills | Status: DC

## 2018-02-16 NOTE — Progress Notes (Signed)
BP 127/82 (BP Location: Left Arm, Patient Position: Sitting, Cuff Size: Normal)   Pulse (!) 51   Temp 98 F (36.7 C)   Ht 5' 9.02" (1.753 m)   Wt 168 lb 6 oz (76.4 kg)   SpO2 99%   BMI 24.85 kg/m    Subjective:    Patient ID: Steven Rowland, male    DOB: 16-Oct-1975, 43 y.o.   MRN: [id number]  HPI: Steven Rowland is a 43 y.o. male  Chief Complaint  Patient presents with  . Annual Exam    Pt is here for general history and physical.    Anxiety Occasional issue.  Gets #30 which lasts about 6 months.    The 10-year ASCVD risk score Mikey Bussing DC Brooke Bonito., et al., 2013) is: 0.5%   Values used to calculate the score:     Age: 20 years     Sex: Male     Is Non-Hispanic African American: No     Diabetic: No     Tobacco smoker: No     Systolic Blood Pressure: 332 mmHg     Is BP treated: No     HDL Cholesterol: 52 mg/dL     Total Cholesterol: 134 mg/dL  Relevant past medical, surgical, family and social history reviewed and updated as indicated. Interim medical history since our last visit reviewed. Allergies and medications reviewed and updated.  Review of Systems  Constitutional: Negative.   HENT: Negative.   Eyes: Negative.   Respiratory: Negative.   Cardiovascular: Negative.   Gastrointestinal: Negative.   Endocrine: Negative.   Genitourinary: Negative.        Long history of ED.  Takes Sildenafil prn  Skin: Negative.        Takes Propecia to prevent baldness.  Also takes Minoxidil   Allergic/Immunologic: Negative.   Neurological: Negative.   Hematological: Negative.   Psychiatric/Behavioral: Negative.     Per HPI unless specifically indicated above     Objective:    BP 127/82 (BP Location: Left Arm, Patient Position: Sitting, Cuff Size: Normal)   Pulse (!) 51   Temp 98 F (36.7 C)   Ht 5' 9.02" (1.753 m)   Wt 168 lb 6 oz (76.4 kg)   SpO2 99%   BMI 24.85 kg/m   Wt Readings from Last 3 Encounters:  02/16/18 168 lb 6 oz (76.4 kg)  04/19/17 163 lb 4.8  oz (74.1 kg)  03/16/16 176 lb 11.2 oz (80.2 kg)    Physical Exam Constitutional:      Appearance: He is well-developed.  HENT:     Head: Normocephalic.     Right Ear: Tympanic membrane, ear canal and external ear normal.     Left Ear: Tympanic membrane, ear canal and external ear normal.     Mouth/Throat:     Pharynx: Uvula midline.  Eyes:     Pupils: Pupils are equal, round, and reactive to light.  Cardiovascular:     Rate and Rhythm: Normal rate and regular rhythm.     Heart sounds: Normal heart sounds. No murmur. No friction rub. No gallop.   Pulmonary:     Effort: Pulmonary effort is normal. No respiratory distress.     Breath sounds: Normal breath sounds.  Abdominal:     General: Bowel sounds are normal. There is no distension.     Palpations: Abdomen is soft.     Tenderness: There is no abdominal tenderness.  Musculoskeletal: Normal range of motion.  Skin:    General:  Skin is warm and dry.  Neurological:     Mental Status: He is alert and oriented to person, place, and time.     Deep Tendon Reflexes: Reflexes are normal and symmetric.  Psychiatric:        Behavior: Behavior normal.        Thought Content: Thought content normal.        Judgment: Judgment normal.     Results for orders placed or performed in visit on 04/19/17  Testosterone, free, total([facility])  Result Value Ref Range   Testosterone 569 264 - 916 ng/dL   Testosterone, Free 11.2 6.8 - 21.5 pg/mL   Sex Hormone Binding 43.6 16.5 - 55.9 nmol/L  CBC with Differential/Platelet  Result Value Ref Range   WBC 3.6 3.4 - 10.8 x10E3/uL   RBC 5.16 4.14 - 5.80 x10E6/uL   Hemoglobin 15.5 13.0 - 17.7 g/dL   Hematocrit 44.3 37.5 - 51.0 %   MCV 86 79 - 97 fL   MCH 30.0 26.6 - 33.0 pg   MCHC 35.0 31.5 - 35.7 g/dL   RDW 12.8 12.3 - 15.4 %   Platelets 216 150 - 379 x10E3/uL   Neutrophils 48 Not Estab. %   Lymphs 37 Not Estab. %   Monocytes 11 Not Estab. %   Eos 3 Not Estab. %   Basos 1 Not Estab. %     Neutrophils Absolute 1.7 1.4 - 7.0 x10E3/uL   Lymphocytes Absolute 1.3 0.7 - 3.1 x10E3/uL   Monocytes Absolute 0.4 0.1 - 0.9 x10E3/uL   EOS (ABSOLUTE) 0.1 0.0 - 0.4 x10E3/uL   Basophils Absolute 0.0 0.0 - 0.2 x10E3/uL   Immature Granulocytes 0 Not Estab. %   Immature Grans (Abs) 0.0 0.0 - 0.1 x10E3/uL  TSH  Result Value Ref Range   TSH 1.440 0.450 - 4.500 uIU/mL  Lipid Panel w/o Chol/HDL Ratio  Result Value Ref Range   Cholesterol, Total 134 100 - 199 mg/dL   Triglycerides 58 0 - 149 mg/dL   HDL 52 >39 mg/dL   VLDL Cholesterol Cal 12 5 - 40 mg/dL   LDL Calculated 70 0 - 99 mg/dL  Comprehensive metabolic panel  Result Value Ref Range   Glucose 85 65 - 99 mg/dL   BUN 17 6 - 24 mg/dL   Creatinine, Ser 0.82 0.76 - 1.27 mg/dL   GFR calc non Af Amer 111 >59 mL/min/1.73   GFR calc Af Amer 128 >59 mL/min/1.73   BUN/Creatinine Ratio 21 (H) 9 - 20   Sodium 141 134 - 144 mmol/L   Potassium 4.6 3.5 - 5.2 mmol/L   Chloride 104 96 - 106 mmol/L   CO2 24 20 - 29 mmol/L   Calcium 9.2 8.7 - 10.2 mg/dL   Total Protein 6.6 6.0 - 8.5 g/dL   Albumin 4.2 3.5 - 5.5 g/dL   Globulin, Total 2.4 1.5 - 4.5 g/dL   Albumin/Globulin Ratio 1.8 1.2 - 2.2   Bilirubin Total 0.7 0.0 - 1.2 mg/dL   Alkaline Phosphatase 52 39 - 117 IU/L   AST 18 0 - 40 IU/L   ALT 18 0 - 44 IU/L      Assessment & Plan:   Problem List Items Addressed This Visit    None    Visit Diagnoses    Routine general medical examination at a health care facility    -  Primary   Relevant Orders   PSA   TSH   CBC with Differential/Platelet   Comprehensive metabolic  panel   Lipid Panel w/o Chol/HDL Ratio   Prostate cancer screening       After shared decision making, pt elects to have a PSA   Relevant Orders   PSA       Follow up plan: Return in about 6 months (around 08/17/2018).

## 2018-02-17 LAB — COMPREHENSIVE METABOLIC PANEL
ALK PHOS: 53 IU/L (ref 39–117)
ALT: 23 IU/L (ref 0–44)
AST: 23 IU/L (ref 0–40)
Albumin/Globulin Ratio: 2 (ref 1.2–2.2)
Albumin: 4.6 g/dL (ref 3.5–5.5)
BUN/Creatinine Ratio: 28 — ABNORMAL HIGH (ref 9–20)
BUN: 27 mg/dL — ABNORMAL HIGH (ref 6–24)
Bilirubin Total: 0.5 mg/dL (ref 0.0–1.2)
CO2: 20 mmol/L (ref 20–29)
CREATININE: 0.95 mg/dL (ref 0.76–1.27)
Calcium: 9.3 mg/dL (ref 8.7–10.2)
Chloride: 101 mmol/L (ref 96–106)
GFR calc Af Amer: 114 mL/min/{1.73_m2} (ref >59)
GFR calc non Af Amer: 99 mL/min/{1.73_m2} (ref >59)
GLUCOSE: 72 mg/dL (ref 65–99)
Globulin, Total: 2.3 g/dL (ref 1.5–4.5)
Potassium: 4.9 mmol/L (ref 3.5–5.2)
SODIUM: 139 mmol/L (ref 134–144)
Total Protein: 6.9 g/dL (ref 6.0–8.5)

## 2018-02-17 LAB — CBC WITH DIFFERENTIAL/PLATELET
BASOS ABS: 0 10*3/uL (ref 0.0–0.2)
Basos: 1 %
EOS (ABSOLUTE): 0.1 10*3/uL (ref 0.0–0.4)
Eos: 2 %
Hematocrit: 43.8 % (ref 37.5–51.0)
Hemoglobin: 15 g/dL (ref 13.0–17.7)
Immature Grans (Abs): 0 10*3/uL (ref 0.0–0.1)
Immature Granulocytes: 0 %
LYMPHS ABS: 1.5 10*3/uL (ref 0.7–3.1)
Lymphs: 33 %
MCH: 29 pg (ref 26.6–33.0)
MCHC: 34.2 g/dL (ref 31.5–35.7)
MCV: 85 fL (ref 79–97)
MONOS ABS: 0.5 10*3/uL (ref 0.1–0.9)
Monocytes: 10 %
Neutrophils Absolute: 2.5 10*3/uL (ref 1.4–7.0)
Neutrophils: 54 %
PLATELETS: 235 10*3/uL (ref 150–450)
RBC: 5.18 x10E6/uL (ref 4.14–5.80)
RDW: 12 % (ref 11.6–15.4)
WBC: 4.5 10*3/uL (ref 3.4–10.8)

## 2018-02-17 LAB — PSA: Prostate Specific Ag, Serum: 0.6 ng/mL (ref 0.0–4.0)

## 2018-02-17 LAB — LIPID PANEL W/O CHOL/HDL RATIO
CHOLESTEROL TOTAL: 164 mg/dL (ref 100–199)
HDL: 50 mg/dL (ref >39)
LDL Calculated: 101 mg/dL — ABNORMAL HIGH (ref 0–99)
TRIGLYCERIDES: 65 mg/dL (ref 0–149)
VLDL Cholesterol Cal: 13 mg/dL (ref 5–40)

## 2018-02-17 LAB — TSH: TSH: 1.97 u[IU]/mL (ref 0.450–4.500)

## 2018-02-23 NOTE — Progress Notes (Signed)
Notified pt by mychart

## 2018-03-07 ENCOUNTER — Encounter: Payer: Self-pay | Admitting: Unknown Physician Specialty

## 2018-03-14 ENCOUNTER — Ambulatory Visit: Payer: Self-pay | Admitting: Physician Assistant

## 2018-03-14 ENCOUNTER — Encounter: Payer: Self-pay | Admitting: Physician Assistant

## 2018-03-14 VITALS — BP 102/80 | HR 69 | Temp 98.4°F | Resp 14 | Wt 161.0 lb

## 2018-03-14 DIAGNOSIS — R05 Cough: Secondary | ICD-10-CM

## 2018-03-14 DIAGNOSIS — R059 Cough, unspecified: Secondary | ICD-10-CM

## 2018-03-14 DIAGNOSIS — J0100 Acute maxillary sinusitis, unspecified: Secondary | ICD-10-CM

## 2018-03-14 MED ORDER — PREDNISONE 50 MG PO TABS: 50.0000 mg | ORAL_TABLET | Freq: Every day | ORAL | 0 refills | Status: AC

## 2018-03-14 MED ORDER — ALBUTEROL SULFATE HFA 108 (90 BASE) MCG/ACT IN AERS: 2.0000 | INHALATION_SPRAY | RESPIRATORY_TRACT | 0 refills | Status: AC | PRN

## 2018-03-14 MED ORDER — AMOXICILLIN-POT CLAVULANATE 875-125 MG PO TABS: 1.0000 | ORAL_TABLET | Freq: Two times a day (BID) | ORAL | 0 refills | Status: AC

## 2018-03-14 NOTE — Patient Instructions (Addendum)
Thank you for choosing [facility] for your health care needs.  You have been diagnosed with sinusitis, associated cough.  Meds ordered this encounter  Medications  . amoxicillin-clavulanate (AUGMENTIN) 875-125 MG tablet    Sig: Take 1 tablet by mouth 2 (two) times daily for 7 days.    Dispense:  14 tablet    Refill:  0    Order Specific Question:   Supervising Provider    Answer:   MILLER, BRIAN [[id number]]  . predniSONE (DELTASONE) 50 MG tablet    Sig: Take 1 tablet (50 mg total) by mouth daily with breakfast for 3 days.    Dispense:  3 tablet    Refill:  0    Order Specific Question:   Supervising Provider    Answer:   MILLER, BRIAN [[id number]]  . albuterol (PROVENTIL HFA;VENTOLIN HFA) 108 (90 Base) MCG/ACT inhaler    Sig: Inhale 2 puffs into the lungs every 4 (four) hours as needed.    Dispense:  1 Inhaler    Refill:  0    Order Specific Question:   Supervising Provider    Answer:   Noemi Chapel [[id number]]   Take antibiotic with food to prevent stomach upset. May use over the counter Imodium for diarrhea.  Increase fluids. Use saline nasal spray and/or Flonase nasal spray. Take hot steamy showers, will help get rid of nasal mucous. May apply warm compresses over maxillary sinuses.  May use albuterol inhaler for cough. May use over the counter Delsym or Robitussin-DM for cough.  Follow-up with family physician, urgent care, or [facility] in 4-5 days if symptoms not improving; sooner with any worsening symptoms.  Hope you feel better soon!  Sinusitis, Adult Sinusitis is soreness and swelling (inflammation) of your sinuses. Sinuses are hollow spaces in the bones around your face. They are located:  Around your eyes.  In the middle of your forehead.  Behind your nose.  In your cheekbones. Your sinuses and nasal passages are lined with a fluid called mucus. Mucus drains out of your sinuses. Swelling can trap mucus in your sinuses. This lets germs (bacteria, virus, or fungus) grow,  which leads to infection. Most of the time, this condition is caused by a virus. What are the causes? This condition is caused by:  Allergies.  Asthma.  Germs.  Things that block your nose or sinuses.  Growths in the nose (nasal polyps).  Chemicals or irritants in the air.  Fungus (rare). What increases the risk? You are more likely to develop this condition if:  You have a weak body defense system (immune system).  You do a lot of swimming or diving.  You use nasal sprays too much.  You smoke. What are the signs or symptoms? The main symptoms of this condition are pain and a feeling of pressure around the sinuses. Other symptoms include:  Stuffy nose (congestion).  Runny nose (drainage).  Swelling and warmth in the sinuses.  Headache.  Toothache.  A cough that may get worse at night.  Mucus that collects in the throat or the back of the nose (postnasal drip).  Being unable to smell and taste.  Being very tired (fatigue).  A fever.  Sore throat.  Bad breath. How is this diagnosed? This condition is diagnosed based on:  Your symptoms.  Your medical history.  A physical exam.  Tests to find out if your condition is short-term (acute) or long-term (chronic). Your doctor may: ? Check your nose for growths (polyps). ? Check your  sinuses using a tool that has a light (endoscope). ? Check for allergies or germs. ? Do imaging tests, such as an MRI or CT scan. How is this treated? Treatment for this condition depends on the cause and whether it is short-term or long-term.  If caused by a virus, your symptoms should go away on their own within 10 days. You may be given medicines to relieve symptoms. They include: ? Medicines that shrink swollen tissue in the nose. ? Medicines that treat allergies (antihistamines). ? A spray that treats swelling of the nostrils. ? Rinses that help get rid of thick mucus in your nose (nasal saline washes).  If caused  by bacteria, your doctor may wait to see if you will get better without treatment. You may be given antibiotic medicine if you have: ? A very bad infection. ? A weak body defense system.  If caused by growths in the nose, you may need to have surgery. Follow these instructions at home: Medicines  Take, use, or apply over-the-counter and prescription medicines only as told by your doctor. These may include nasal sprays.  If you were prescribed an antibiotic medicine, take it as told by your doctor. Do not stop taking the antibiotic even if you start to feel better. Hydrate and humidify   Drink enough water to keep your pee (urine) pale yellow.  Use a cool mist humidifier to keep the humidity level in your home above 50%.  Breathe in steam for 10-15 minutes, 3-4 times a day, or as told by your doctor. You can do this in the bathroom while a hot shower is running.  Try not to spend time in cool or dry air. Rest  Rest as much as you can.  Sleep with your head raised (elevated).  Make sure you get enough sleep each night. General instructions   Put a warm, moist washcloth on your face 3-4 times a day, or as often as told by your doctor. This will help with discomfort.  Wash your hands often with soap and water. If there is no soap and water, use hand sanitizer.  Do not smoke. Avoid being around people who are smoking (secondhand smoke).  Keep all follow-up visits as told by your doctor. This is important. Contact a doctor if:  You have a fever.  Your symptoms get worse.  Your symptoms do not get better within 10 days. Get help right away if:  You have a very bad headache.  You cannot stop throwing up (vomiting).  You have very bad pain or swelling around your face or eyes.  You have trouble seeing.  You feel confused.  Your neck is stiff.  You have trouble breathing. Summary  Sinusitis is swelling of your sinuses. Sinuses are hollow spaces in the bones around  your face.  This condition is caused by tissues in your nose that become inflamed or swollen. This traps germs. These can lead to infection.  If you were prescribed an antibiotic medicine, take it as told by your doctor. Do not stop taking it even if you start to feel better.  Keep all follow-up visits as told by your doctor. This is important. This information is not intended to replace advice given to you by your health care provider. Make sure you discuss any questions you have with your health care provider. Document Released: 07/06/2007 Document Revised: 06/19/2017 Document Reviewed: 06/19/2017 Elsevier Interactive Patient Education  2019 [employer].

## 2018-03-14 NOTE — Progress Notes (Signed)
Patient ID: Steven Rowland DOB: 02-Jul-1975 AGE: 43 y.o. MRN: [id number]   PCP: Kathrine Haddock, NP   Chief Complaint:  Chief Complaint  Patient presents with  . cough and congestion    x1week (mucinex, Nyquil, cepacol drops, afrin and flonase)     Subjective:    HPI:  Steven Rowland is a 43 y.o. male presents for evaluation  Chief Complaint  Patient presents with  . cough and congestion    x1week (mucinex, Nyquil, cepacol drops, afrin and flonase)   43 year old male presents to [facility] with one week history of URI symptoms. Began with fever. 100F. Lasted several hours. Self resolved. Following day developed dry cough. Persistent. Nagging. Annoying. Over past three days has developed associated nasal congestion, bilateral maxillary sinus pressure/congestion/pain, postnasal drip, and chest congestion. Has used OTC Mucinex-D, Afrin nasal spray, Flonase nasal spray, and Nyquil with minimal symptom relief. States unable to breathe through nose. Associated headache; describes as pressure. Patient denies return of fever, chills, sweats, body aches, dizziness/lightheadedness, ear pain, chest pain, SOB, wheezing, nausea/vomiting, diarrhea. Patient did receive this season's influenza vaccination. Patient's son last week diagnosed with flu.  Patient rarely ill. States he had similar sinusitis symptoms last year, treated with ZPak. Patient with URI/sinusitis symptoms five years ago, treated with Omnicef. Patient with no smoking history. Diagnosis of exercise induced asthma. Has used albuterol inhaler a few times to treat coughing fits.  Patient regularly followed by Kathrine Haddock NP with [facility]. Last seen 02/16/2018. Patient seen for routine health maintenance, no other medical conditions. Does not take any OTC medications regularly other than Xanax and Sildenafil as needed.  A limited review of symptoms was performed, pertinent positives and negatives as  mentioned in HPI.  The following portions of the patient's history were reviewed and updated as appropriate: allergies, current medications and past medical history.  Patient Active Problem List   Diagnosis Date Noted  . Exercise-induced asthma 08/20/2014  . ED (erectile dysfunction) 08/20/2014    No Known Allergies  Current Outpatient Medications on File Prior to Visit  Medication Sig Dispense Refill  . ALPRAZolam (XANAX) 0.5 MG tablet Take 1 prn anxiety 30 tablet 0  . finasteride (PROPECIA) 1 MG tablet Take 1 tablet (1 mg total) by mouth daily. 90 tablet 3  . sildenafil (REVATIO) 20 MG tablet 1-5 tabs prn 90 tablet 1  . TRANSDERM-SCOP, 1.5 MG, 1 MG/3DAYS PLACE ONE PATCH ONTO THE SKIN EVERY THREE DAYS 10 patch 0   No current facility-administered medications on file prior to visit.        Objective:   Vitals:   03/14/18 1538  BP: 102/80  Pulse: 69  Resp: 14  Temp: 98.4 F (36.9 C)  SpO2: 98%     Wt Readings from Last 3 Encounters:  03/14/18 161 lb (73 kg)  02/16/18 168 lb 6 oz (76.4 kg)  04/19/17 163 lb 4.8 oz (74.1 kg)    Physical Exam:   General Appearance:  Patient sitting comfortably on examination table. Conversational. Steven Rowland self-historian. In no acute distress. Afebrile.   Head:  Normocephalic, without obvious abnormality, atraumatic  Eyes:  PERRL, conjunctiva/corneas clear, EOM's intact  Ears:  Bilateral ear canals WNL. No erythema or edema. No discharge/drainage. Bilateral TMs WNL. No erythema, injection, or serous effusion. No scar tissue.  Nose: Nares normal, septum midline. Nasal mucosa with significant edema, right nare swollen almost completely shut. Scant thick clear/pale yellow rhinorrhea. Mild tenderness with palpation over bilateral maxillary sinuses.  Throat: Lips, mucosa, and tongue normal; teeth and gums normal. Throat reveals no erythema. Tonsils with no enlargement or exudate.  Neck: Supple, symmetrical, trachea midline, mild bilateral anterior  cervical adenopathy (mild tenderness with palpation)  Lungs:   Clear to auscultation bilaterally, respirations unlabored. Good aeration. No cough elicited with deep inspiration. No wheezing with forced expiration. No audible rales or rhonchi or crackles.  Heart:  Regular rate and rhythm, S1 and S2 normal, no murmur, rub, or gallop  Extremities: Extremities normal, atraumatic, no cyanosis or edema  Pulses: 2+ and symmetric  Skin: Skin color, texture, turgor normal, no rashes or lesions  Lymph nodes: Cervical, supraclavicular, and axillary nodes normal  Neurologic: Normal    Assessment & Plan:    Exam findings, diagnosis etiology and medication use and indications reviewed with patient. Follow-Up and discharge instructions provided. No emergent/urgent issues found on exam.  Patient education was provided.   Patient verbalized understanding of information provided and agrees with plan of care (POC), all questions answered. The patient is advised to call or return to clinic if condition does not see an improvement in symptoms, or to seek the care of the closest emergency department if condition worsens with the below plan.    1. Acute non-recurrent maxillary sinusitis - amoxicillin-clavulanate (AUGMENTIN) 875-125 MG tablet; Take 1 tablet by mouth 2 (two) times daily for 7 days.  Dispense: 14 tablet; Refill: 0 - predniSONE (DELTASONE) 50 MG tablet; Take 1 tablet (50 mg total) by mouth daily with breakfast for 3 days.  Dispense: 3 tablet; Refill: 0  2. Cough - albuterol (PROVENTIL HFA;VENTOLIN HFA) 108 (90 Base) MCG/ACT inhaler; Inhale 2 puffs into the lungs every 4 (four) hours as needed.  Dispense: 1 Inhaler; Refill: 0   Patient with one week history of URI symptoms. Past few days has had nasal congestion and bilateral maxillary sinus pressure/congestion (tenderness with palpation). Believe patient initially had flu or viral URI, has since developed secondary sinusitis. VSS, afebrile, in no  acute distress, clear lung sounds. Prescribed Augmentin for bacterial sinusitis. Prescribed short course/blast of prednisone (50mg  qd x 3 days) for severity of congestion/pressure and will help with cough if associated bronchospasm given asthma history. Provided refill of albuterol inhaler. No wheezing audible on exam. Advised rest, increase fluids, saline nasal spray, continuation of Mucinex, and warm compresses over sinuses. Advised patient f/u with PCP or urgent care in 4-5 days if symptoms not improving, sooner with any worsening symptoms.   Darlin Priestly, MHS, PA-C Montey Hora, MHS, PA-C Advanced Practice Provider [facility]  [address], [facility], [place], [place] [address] (p):  [phone] [email or web address]

## 2018-03-16 ENCOUNTER — Telehealth: Payer: Self-pay | Admitting: Emergency Medicine

## 2018-03-16 NOTE — Telephone Encounter (Signed)
Left message following up on visit with [facility] 

## 2018-04-26 ENCOUNTER — Emergency Department: Payer: 59

## 2018-04-26 ENCOUNTER — Other Ambulatory Visit: Payer: Self-pay

## 2018-04-26 ENCOUNTER — Emergency Department
Admission: EM | Admit: 2018-04-26 | Discharge: 2018-04-26 | Disposition: A | Discharge status: Home / Self Care | Payer: 59 | Attending: Emergency Medicine | Admitting: Emergency Medicine

## 2018-04-26 ENCOUNTER — Encounter: Payer: Self-pay | Admitting: Emergency Medicine

## 2018-04-26 DIAGNOSIS — R200 Anesthesia of skin: Secondary | ICD-10-CM | POA: Diagnosis not present

## 2018-04-26 DIAGNOSIS — J45909 Unspecified asthma, uncomplicated: Secondary | ICD-10-CM | POA: Diagnosis not present

## 2018-04-26 DIAGNOSIS — X58XXXA Exposure to other specified factors, initial encounter: Secondary | ICD-10-CM | POA: Insufficient documentation

## 2018-04-26 DIAGNOSIS — S6991XA Unspecified injury of right wrist, hand and finger(s), initial encounter: Secondary | ICD-10-CM | POA: Diagnosis present

## 2018-04-26 DIAGNOSIS — S60551D Superficial foreign body of right hand, subsequent encounter: Secondary | ICD-10-CM | POA: Diagnosis not present

## 2018-04-26 DIAGNOSIS — S61441A Puncture wound with foreign body of right hand, initial encounter: Secondary | ICD-10-CM | POA: Insufficient documentation

## 2018-04-26 DIAGNOSIS — Y9389 Activity, other specified: Secondary | ICD-10-CM | POA: Diagnosis not present

## 2018-04-26 DIAGNOSIS — Y9289 Other specified places as the place of occurrence of the external cause: Secondary | ICD-10-CM | POA: Insufficient documentation

## 2018-04-26 DIAGNOSIS — Y998 Other external cause status: Secondary | ICD-10-CM | POA: Diagnosis not present

## 2018-04-26 DIAGNOSIS — S61021A Laceration with foreign body of right thumb without damage to nail, initial encounter: Secondary | ICD-10-CM | POA: Diagnosis not present

## 2018-04-26 DIAGNOSIS — S60551A Superficial foreign body of right hand, initial encounter: Secondary | ICD-10-CM | POA: Diagnosis not present

## 2018-04-26 MED ORDER — IBUPROFEN 600 MG PO TABS
600.0000 mg | ORAL_TABLET | Freq: Once | ORAL | Status: AC
  Administered 2018-04-26: 600 mg via ORAL
  Filled 2018-04-26: qty 1

## 2018-04-26 MED ORDER — CEFAZOLIN SODIUM 1 G IJ SOLR: 1.0000 g | Freq: Once | INTRAMUSCULAR | Status: DC

## 2018-04-26 MED ORDER — LIDOCAINE HCL (PF) 1 % IJ SOLN
5.0000 mL | Freq: Once | INTRAMUSCULAR | Status: AC
  Administered 2018-04-26: 5 mL
  Filled 2018-04-26: qty 5

## 2018-04-26 MED ORDER — CEPHALEXIN 500 MG PO CAPS: 500.0000 mg | ORAL_CAPSULE | Freq: Three times a day (TID) | ORAL | 0 refills | Status: AC

## 2018-04-26 MED ORDER — OXYCODONE-ACETAMINOPHEN 5-325 MG PO TABS: 1.0000 | ORAL_TABLET | ORAL | 0 refills | Status: DC | PRN

## 2018-04-26 MED ORDER — ALPRAZOLAM 0.5 MG PO TABS
0.5000 mg | ORAL_TABLET | Freq: Once | ORAL | Status: AC
  Administered 2018-04-26: 0.5 mg via ORAL
  Filled 2018-04-26: qty 1

## 2018-04-26 MED ORDER — CEFAZOLIN SODIUM 1 G IJ SOLR
2.0000 g | Freq: Once | INTRAMUSCULAR | Status: AC
  Administered 2018-04-26: 2 g via INTRAMUSCULAR
  Filled 2018-04-26: qty 20

## 2018-04-26 NOTE — ED Notes (Signed)
Pt's wound dressed

## 2018-04-26 NOTE — ED Triage Notes (Signed)
Has wood in right hand from outer hand and comes out his palm. Happened when he was loading some wood

## 2018-04-26 NOTE — ED Notes (Signed)
Foreign object removed by MD Alfred Levins; bleeding contained

## 2018-04-26 NOTE — ED Provider Notes (Signed)
[facility] Emergency Department Provider Note  ____________________________________________  Time seen: Approximately 12:52 PM  I have reviewed the triage vital signs and the nursing notes.   HISTORY  Chief Complaint Puncture Wound   HPI Steven Rowland is a 43 y.o. male no significant past medical history who presents for evaluation of wood splinter in his right hand.  Patient is left-handed.  He was loading wood at [facility] when a long large piece of wood became stuck in his right hand.  Tetanus is up-to-date.  He is complaining of sharp shooting pain has been present since this happened which is moderate in intensity.  Is also complaining of numbness of digits 1 and 2 of the right hand.  Past Medical History:  Diagnosis Date  . Asthma   . ED (erectile dysfunction)   . Hypogonadism in male     Patient Active Problem List   Diagnosis Date Noted  . Exercise-induced asthma 08/20/2014  . ED (erectile dysfunction) 08/20/2014    Past Surgical History:  Procedure Laterality Date  . SURGERY SCROTAL / TESTICULAR  1982   undescended testicle    Prior to Admission medications   Medication Sig Start Date End Date Taking? Authorizing Provider  albuterol (PROVENTIL HFA;VENTOLIN HFA) 108 (90 Base) MCG/ACT inhaler Inhale 2 puffs into the lungs every 4 (four) hours as needed. 03/14/18   Darlin Priestly, PA-C  ALPRAZolam Duanne Moron) 0.5 MG tablet Take 1 prn anxiety 02/16/18   Kathrine Haddock, NP  cephALEXin (KEFLEX) 500 MG capsule Take 1 capsule (500 mg total) by mouth 3 (three) times daily for 7 days. 04/26/18 05/03/18  Rudene Re, MD  finasteride (PROPECIA) 1 MG tablet Take 1 tablet (1 mg total) by mouth daily. 02/16/18   Kathrine Haddock, NP  oxyCODONE-acetaminophen (PERCOCET) 5-325 MG tablet Take 1 tablet by mouth every 4 (four) hours as needed. 04/26/18   Rudene Re, MD  sildenafil (REVATIO) 20 MG tablet 1-5 tabs prn 02/16/18   Kathrine Haddock, NP   TRANSDERM-SCOP, 1.5 MG, 1 MG/3DAYS PLACE ONE PATCH ONTO THE SKIN EVERY THREE DAYS 07/18/16   Kathrine Haddock, NP    Allergies Patient has no known allergies.  Family History  Problem Relation Age of Onset  . Arthritis Mother   . Stroke Father   . Hyperlipidemia Father   . Hypertension Father   . Kidney Stones Father   . Asthma Brother   . Thyroid disease Brother   . Eczema Son   . Cancer Maternal Grandmother   . Cancer Maternal Grandfather   . Kidney disease Neg Hx   . Prostate cancer Neg Hx     Social History Social History   Tobacco Use  . Smoking status: Never Smoker  . Smokeless tobacco: Never Used  Substance Use Topics  . Alcohol use: Yes    Alcohol/week: 1.0 - 2.0 standard drinks    Types: 1 - 2 Cans of beer per week  . Drug use: No    Review of Systems  Constitutional: Negative for fever. Cardiovascular: Negative for chest pain. Respiratory: Negative for shortness of breath. Genitourinary: Negative for dysuria. Musculoskeletal: Negative for back pain. Skin: Negative for rash. + large splinter in the R hand Neurological: Negative for headaches, weakness or numbness. Psych: No SI or HI  ____________________________________________   PHYSICAL EXAM:  VITAL SIGNS: ED Triage Vitals [04/26/18 1215]  Enc Vitals Group     BP (!) 155/97     Pulse Rate 69     Resp 16  Temp 97.9 F (36.6 C)     Temp Source Oral     SpO2 99 %     Weight 162 lb (73.5 kg)     Height 5\' 10"  (1.778 m)     Head Circumference      Peak Flow      Pain Score 9     Pain Loc      Pain Edu?      Excl. in [place]?     Constitutional: Alert and oriented. Well appearing and in no apparent distress. HEENT:      Head: Normocephalic and atraumatic.         Eyes: Conjunctivae are normal. Sclera is non-icteric.       Mouth/Throat: Mucous membranes are moist.       Neck: Supple with no signs of meningismus. Cardiovascular: Regular rate and rhythm.  Respiratory: Normal respiratory  effort. Lungs are clear to auscultation bilaterally. No wheezes, crackles, or rhonchi.  Musculoskeletal: There is a 10cm by 0.5cm piece of wood that enters at the base of the R thumb and goes through the hand exiting at the 1st web space. Paresthesia of the R thumb and R 2nd digit, intact motor exam, normal brisk cap refill.  Neurologic: Normal speech and language. Face is symmetric. Moving all extremities. No gross focal neurologic deficits are appreciated. Skin: Skin is warm, dry and intact. No rash noted. Psychiatric: Mood and affect are normal. Speech and behavior are normal.  ____________________________________________   LABS (all labs ordered are listed, but only abnormal results are displayed)  Labs Reviewed - No data to display ____________________________________________  EKG  none  ____________________________________________  RADIOLOGY  I have personally reviewed the images performed during this visit and I agree with the Radiologist's read.   Interpretation by Radiologist:  [place] Extrem Up Right Ltd  Result Date: 04/26/2018 CLINICAL DATA:  Splinter.  Evaluate for retained foreign body. EXAM: ULTRASOUND RIGHT UPPER EXTREMITY LIMITED TECHNIQUE: Ultrasound examination of the upper extremity soft tissues was performed in the area of clinical concern. COMPARISON:  Right hand x-rays from same day. FINDINGS: Focused ultrasound along the palmar aspect of the thenar eminence near the splinter entry site demonstrates a small subcutaneous tract without evidence of hyperechoic foreign body. Focused ultrasound along the dorsal aspect of the thenar eminence demonstrates multiple, at least 20, intramuscular, hyperechoic foci. Two of these foci are somewhat linear in appearance, measuring 4-5 mm (best seen on the last set of cine images). There is mild hyperemia. No fluid collection or soft tissue mass. IMPRESSION: Multiple intramuscular, hyperechoic foci within the thenar eminence likely  represent soft tissue emphysema. However, two of these foci are somewhat linear in appearance, measuring 4-5 mm, and could potentially represent tiny residual wood fragments. Electronically Signed   By: Titus Dubin M.D.   On: 04/26/2018 14:19   Dg Hand Complete Right  Result Date: 04/26/2018 CLINICAL DATA:  Status post foreign body removal EXAM: RIGHT HAND - COMPLETE 3+ VIEW COMPARISON:  None. FINDINGS: The right hand demonstrates no fracture or dislocation. Mild soft tissue emphysema along the thenar eminence of the right hand likely at the site of foreign body injury with subsequent removal. No radiopaque foreign body. IMPRESSION: No radiopaque foreign body. Typically, would is radiolucent and may not be apparent on plain radiography. If there is further clinical concern regarding retained would not foreign body recommend an ultrasound of the right hand. Electronically Signed   By: Kathreen Devoid   On: 04/26/2018 13:12  ____________________________________________   PROCEDURES  Procedure(s) performed:yes .Foreign Body Removal Date/Time: 04/26/2018 12:56 PM Performed by: Rudene Re, MD Authorized by: Rudene Re, MD  Consent: Verbal consent obtained. Risks and benefits: risks, benefits and alternatives were discussed Consent given by: patient Patient understanding: patient states understanding of the procedure being performed Intake: hand. Anesthesia: local infiltration  Anesthesia: Local Anesthetic: lidocaine 1% without epinephrine Anesthetic total: 5 mL  Sedation: Patient sedated: no  Patient restrained: no Patient cooperative: yes Complexity: simple 1 objects recovered. Objects recovered: 10x0.5 cm wood splinter Post-procedure assessment: foreign body removed Patient tolerance: Patient tolerated the procedure well with no immediate complications   Critical Care performed:  None ____________________________________________   INITIAL IMPRESSION /  ASSESSMENT AND PLAN / ED COURSE  44 y.o. male no significant past medical history who presents for evaluation of large 10x0.5 cm wood splinter in his right hand.  Tetanus shot is up-to-date.  Discussed with Dr. Mack Guise from orthopedics who recommended just removing the foreign object.  Splinter was removed successfully per procedure note above.  The piece of wood seem to be intact with low suspicion for any retained pieces of wood.  X-ray showed no evidence of foreign body. [place] pending.  Patient given a dose of Ancef.  Wound was washed thoroughly once foreign body was removed.  Motor exam intact, vascular exam intact, patient does have mild paresthesias and numbness of the right thumb and right second digit.    _________________________ 2:40 PM on 04/26/2018 -----------------------------------------  [place] with possible retained small piece of wood. Discussed with Dr. Mack Guise who recommended sending patient to clinic now so Dr. Peggye Ley, hand surgeon can take a look at patient's hand.  Wound care was discussed with patient.  He was given prescription for Keflex and sent to [facility] for further evaluation.      As part of my medical decision making, I reviewed the following data within the [facility] notes reviewed and incorporated, Old chart reviewed, Radiograph reviewed , A consult was requested and obtained from this/these consultant(s) Orthopedics, Notes from prior ED visits and [place] Controlled Substance Database    Pertinent labs & imaging results that were available during my care of the patient were reviewed by me and considered in my medical decision making (see chart for details).    ____________________________________________   FINAL CLINICAL IMPRESSION(S) / ED DIAGNOSES  Final diagnoses:  Puncture wound of right hand with foreign body, initial encounter      NEW MEDICATIONS STARTED DURING THIS VISIT:  ED Discharge Orders         Ordered     cephALEXin (KEFLEX) 500 MG capsule  3 times daily     04/26/18 1437    oxyCODONE-acetaminophen (PERCOCET) 5-325 MG tablet  Every 4 hours PRN     04/26/18 1437           Note:  This document was prepared using Dragon voice recognition software and may include unintentional dictation errors.    Alfred Levins, [place], MD 04/26/18 [phone]

## 2018-05-03 DIAGNOSIS — S61011D Laceration without foreign body of right thumb without damage to nail, subsequent encounter: Secondary | ICD-10-CM | POA: Diagnosis not present

## 2018-06-20 ENCOUNTER — Other Ambulatory Visit: Payer: Self-pay | Admitting: Unknown Physician Specialty

## 2018-06-20 NOTE — Telephone Encounter (Signed)
Needs virtual or face to face visit as has not been seen since January with [facility].  If appointment scheduled I will provide enough tablets to last to appointment.  Thanks.

## 2018-06-20 NOTE — Telephone Encounter (Signed)
Requested medication (s) are due for refill today: yes  Requested medication (s) are on the active medication list:yes  Last refill:  02/16/2018  Future visit scheduled:no  Notes to clinic: Not delegated    Requested Prescriptions  Pending Prescriptions Disp Refills   ALPRAZolam (XANAX) 0.5 MG tablet [Pharmacy Med Name: ALPRAZolam 0.5 MG TABS 0.5 TAB] 30 tablet 0    Sig: TAKE 1 TABLET BY MOUTH AS NEEDED FOR ANXIETY     Not Delegated - Psychiatry:  Anxiolytics/Hypnotics Failed - 06/20/2018  3:39 PM      Failed - This refill cannot be delegated      Failed - Urine Drug Screen completed in last 360 days.      Passed - Valid encounter within last 6 months    Recent Outpatient Visits          4 months ago Routine general medical examination at a health care facility   [place], NP   1 year ago Routine general medical examination at a health care facility   [place], NP   2 years ago Routine general medical examination at a health care facility   [place], NP   2 years ago Fever, unspecified fever cause   [place], NP   2 years ago Acute recurrent frontal sinusitis   [facility] Kathrine Haddock, NP

## 2018-06-21 NOTE — Telephone Encounter (Signed)
Spoke with pt and he said he has about 16 pills left and he will call us when he needs [place] as he was just trying to be proactive in requesting meds.

## 2018-06-21 NOTE — Telephone Encounter (Signed)
Thank you.  I have not met patient yet and he was not seen for anxiety at most recent visit, so for refills on this I feel more comfortable having virtual visit per the rules with controlled medications.  Thanks.  So he will need virtual or face to face visit when due for refill.

## 2018-08-05 ENCOUNTER — Encounter: Payer: Self-pay | Admitting: Nurse Practitioner

## 2018-08-05 DIAGNOSIS — F411 Generalized anxiety disorder: Secondary | ICD-10-CM | POA: Insufficient documentation

## 2018-08-07 ENCOUNTER — Ambulatory Visit: Payer: Self-pay | Admitting: Nurse Practitioner

## 2018-08-08 ENCOUNTER — Ambulatory Visit (INDEPENDENT_AMBULATORY_CARE_PROVIDER_SITE_OTHER): Payer: 59 | Admitting: Nurse Practitioner

## 2018-08-08 ENCOUNTER — Encounter: Payer: Self-pay | Admitting: Nurse Practitioner

## 2018-08-08 ENCOUNTER — Other Ambulatory Visit: Payer: Self-pay

## 2018-08-08 DIAGNOSIS — F411 Generalized anxiety disorder: Secondary | ICD-10-CM | POA: Diagnosis not present

## 2018-08-08 MED ORDER — ALPRAZOLAM 0.5 MG PO TABS: ORAL_TABLET | ORAL | 1 refills | Status: DC

## 2018-08-08 NOTE — Progress Notes (Signed)
There were no vitals taken for this visit.   Subjective:    Patient ID: Steven Rowland, male    DOB: 08/28/1975, 43 y.o.   MRN: [id number]  HPI: Steven Rowland is a 43 y.o. male  Chief Complaint  Patient presents with  . Medication Refill  . Anxiety    . This visit was completed via WebEx due to the restrictions of the COVID-19 pandemic. All issues as above were discussed and addressed. Physical exam was done as above through visual confirmation on WebEx. If it was felt that the patient should be evaluated in the office, they were directed there. The patient verbally consented to this visit. . Location of the patient: home . Location of the provider: work . Those involved with this call:  . Provider: Marnee Guarneri, DNP . CMA: Gerda Diss, CMA . Front Desk/Registration: Jill Side  . Time spent on call: 15 minutes with patient face to face via video conference. More than 50% of this time was spent in counseling and coordination of care. 5 minutes total spent in review of patient's record and preparation of their chart. I verified patient identity using two factors (patient name and date of birth). Patient consents verbally to being seen via telemedicine visit today.   ANXIETY/STRESS Currently takes Xanax 0.5MG  as needed.  Last fill 02/16/18 and on review of note #30 tablets lasts him 6 months.  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  He is aware of risks of benzo use. Duration:controlled Anxious mood: no  Excessive worrying: no Irritability: no  Sweating: no Nausea: no Palpitations:no Hyperventilation: no Panic attacks: no Agoraphobia: no  Obscessions/compulsions: no Depressed mood: no Depression screen [facility] 2/9 08/08/2018 04/19/2017 02/24/2015  Decreased Interest 0 0 0  Down, Depressed, Hopeless  0 0 0  PHQ - 2 Score 0 0 0  Altered sleeping 0 - -  Tired, decreased energy 0 - -  Change in appetite 0 - -  Feeling bad or failure about yourself  0 - -  Trouble concentrating 0 - -  Moving slowly or fidgety/restless 0 - -  Suicidal thoughts 0 - -  PHQ-9 Score 0 - -  Difficult doing work/chores Not difficult at all - -   Anhedonia: no Weight changes: no Insomnia: none Hypersomnia: no Fatigue/loss of energy: no Feelings of worthlessness: no Feelings of guilt: no Impaired concentration/indecisiveness: no Suicidal ideations: no  Crying spells: no Recent Stressors/Life Changes: no   Relationship problems: no   Family stress: no     Financial stress: no    Job stress: no    Recent death/loss: no  GAD 7 : Generalized Anxiety Score 08/08/2018  Nervous, Anxious, on Edge 0  Control/stop worrying 0  Worry too much - different things 0  Trouble relaxing 1  Restless 0  Easily annoyed or irritable 0  Afraid - awful might happen 0  Total GAD 7 Score 1  Anxiety Difficulty Not difficult at all     Relevant past medical, surgical, family and social history reviewed and updated as indicated. Interim medical history since our last visit reviewed. Allergies and medications reviewed and updated.  Review of Systems  Constitutional: Negative for activity change, diaphoresis, fatigue and fever.  Respiratory: Negative for cough, chest tightness, shortness of breath and wheezing.   Cardiovascular:  Negative for chest pain, palpitations and leg swelling.  Gastrointestinal: Negative for abdominal distention, abdominal pain, constipation, diarrhea, nausea and vomiting.  Skin: Negative.   Psychiatric/Behavioral: Negative.     Per HPI unless specifically indicated above     Objective:    There were no vitals taken for this visit.  Wt Readings from Last 3 Encounters:  04/26/18 162 lb (73.5 kg)  03/14/18 161 lb (73 kg)  02/16/18 168 lb 6 oz (76.4 kg)    Physical Exam Vitals signs and  nursing note reviewed.  Constitutional:      General: He is awake. He is not in acute distress.    Appearance: He is well-developed. He is not ill-appearing.  HENT:     Head: Normocephalic.     Right Ear: Hearing normal. No drainage.     Left Ear: Hearing normal. No drainage.  Eyes:     General: Lids are normal.        Right eye: No discharge.        Left eye: No discharge.     Conjunctiva/sclera: Conjunctivae normal.  Neck:     Musculoskeletal: Normal range of motion.  Cardiovascular:     Comments: Unable to auscultate due to virtual exam only  Pulmonary:     Effort: Pulmonary effort is normal. No accessory muscle usage or respiratory distress.     Comments: Unable to auscultate due to virtual exam only  Neurological:     Mental Status: He is alert and oriented to person, place, and time.  Psychiatric:        Mood and Affect: Mood normal.        Behavior: Behavior normal. Behavior is cooperative.        Thought Content: Thought content normal.        Judgment: Judgment normal.     Results for orders placed or performed in visit on 02/16/18  PSA  Result Value Ref Range   Prostate Specific Ag, Serum 0.6 0.0 - 4.0 ng/mL  TSH  Result Value Ref Range   TSH 1.970 0.450 - 4.500 uIU/mL  CBC with Differential/Platelet  Result Value Ref Range   WBC 4.5 3.4 - 10.8 x10E3/uL   RBC 5.18 4.14 - 5.80 x10E6/uL   Hemoglobin 15.0 13.0 - 17.7 g/dL   Hematocrit 43.8 37.5 - 51.0 %   MCV 85 79 - 97 fL   MCH 29.0 26.6 - 33.0 pg   MCHC 34.2 31.5 - 35.7 g/dL   RDW 12.0 11.6 - 15.4 %   Platelets 235 150 - 450 x10E3/uL   Neutrophils 54 Not Estab. %   Lymphs 33 Not Estab. %   Monocytes 10 Not Estab. %   Eos 2 Not Estab. %   Basos 1 Not Estab. %   Neutrophils Absolute 2.5 1.4 - 7.0 x10E3/uL   Lymphocytes Absolute 1.5 0.7 - 3.1 x10E3/uL   Monocytes Absolute 0.5 0.1 - 0.9 x10E3/uL   EOS (ABSOLUTE) 0.1 0.0 - 0.4 x10E3/uL   Basophils Absolute 0.0 0.0 - 0.2 x10E3/uL   Immature Granulocytes  0 Not Estab. %   Immature Grans (Abs) 0.0 0.0 - 0.1 x10E3/uL  Comprehensive metabolic panel  Result Value Ref Range   Glucose 72 65 - 99 mg/dL   BUN 27 (H) 6 - 24 mg/dL   Creatinine, Ser 0.95 0.76 - 1.27 mg/dL   GFR calc non Af Amer 99 >59 mL/min/1.73   GFR calc Af Amer 114 >59 mL/min/1.73   BUN/Creatinine Ratio 28 (H) 9 -  20   Sodium 139 134 - 144 mmol/L   Potassium 4.9 3.5 - 5.2 mmol/L   Chloride 101 96 - 106 mmol/L   CO2 20 20 - 29 mmol/L   Calcium 9.3 8.7 - 10.2 mg/dL   Total Protein 6.9 6.0 - 8.5 g/dL   Albumin 4.6 3.5 - 5.5 g/dL   Globulin, Total 2.3 1.5 - 4.5 g/dL   Albumin/Globulin Ratio 2.0 1.2 - 2.2   Bilirubin Total 0.5 0.0 - 1.2 mg/dL   Alkaline Phosphatase 53 39 - 117 IU/L   AST 23 0 - 40 IU/L   ALT 23 0 - 44 IU/L  Lipid Panel w/o Chol/HDL Ratio  Result Value Ref Range   Cholesterol, Total 164 100 - 199 mg/dL   Triglycerides 65 0 - 149 mg/dL   HDL 50 >39 mg/dL   VLDL Cholesterol Cal 13 5 - 40 mg/dL   LDL Calculated 101 (H) 0 - 99 mg/dL      Assessment & Plan:   Problem List Items Addressed This Visit      Other   GAD (generalized anxiety disorder)    Chronic, stable.  Minimally uses Xanax.  Continue current medication regimen and recommend reduction and switch to SSRI if ongoing issues.  Return in 6 months.        Relevant Medications   ALPRAZolam (XANAX) 0.5 MG tablet      Controlled substance.  Checked data base and no other refills of controlled substances noted in past few months. Had one oxycodone-APAP fill 04/26/18 for acute injury and last Xanax fill 02/21/18.  Follow up plan: Return in about 6 months (around 02/08/2019) for Annual physical.

## 2018-08-08 NOTE — Assessment & Plan Note (Signed)
Chronic, stable.  Minimally uses Xanax.  Continue current medication regimen and recommend reduction and switch to SSRI if ongoing issues.  Return in 6 months.

## 2018-08-08 NOTE — Patient Instructions (Signed)

## 2018-08-17 ENCOUNTER — Ambulatory Visit: Payer: 59 | Admitting: Unknown Physician Specialty

## 2018-09-10 ENCOUNTER — Other Ambulatory Visit: Payer: Self-pay | Admitting: Unknown Physician Specialty

## 2018-09-10 NOTE — Telephone Encounter (Signed)
Requested Prescriptions  Pending Prescriptions Disp Refills  . sildenafil (REVATIO) 20 MG tablet [Pharmacy Med Name: SILDENAFIL 20 MG TABLET] 90 tablet 2    Sig: TAKE 1-5 TABLETS BY MOUTH DAILY AS NEEDED     Urology: Erectile Dysfunction Agents Failed - 09/10/2018  3:42 PM      Failed - Last BP in normal range    BP Readings from Last 1 Encounters:  04/26/18 (!) 132/92         Passed - Valid encounter within last 12 months    Recent Outpatient Visits          1 month ago GAD (generalized anxiety disorder)   [place] Cannady, [place], NP   6 months ago Routine general medical examination at a health care facility   [place], NP   1 year ago Routine general medical examination at a health care facility   [place], NP   2 years ago Routine general medical examination at a health care facility   [place], NP   2 years ago Fever, unspecified fever cause   [facility] Kathrine Haddock, NP      Future Appointments            In 5 months Cannady, Barbaraann Faster, NP [facility]

## 2018-12-17 DIAGNOSIS — H5213 Myopia, bilateral: Secondary | ICD-10-CM | POA: Diagnosis not present

## 2019-01-21 ENCOUNTER — Other Ambulatory Visit: Payer: Self-pay | Admitting: Nurse Practitioner

## 2019-01-21 NOTE — Telephone Encounter (Signed)
Can you find out if he has enough until his appointment with Jolene? Otherwise I'll refill

## 2019-01-21 NOTE — Telephone Encounter (Signed)
Requested medication (s) are due for refill today: yes  Requested medication (s) are on the active medication list: yes  Last refill:  08/08/2018  Future visit scheduled: yes  Notes to clinic:  not delegated    Requested Prescriptions  Pending Prescriptions Disp Refills   ALPRAZolam (XANAX) 0.5 MG tablet [Pharmacy Med Name: ALPRAZolam 0.5 MG TABS 0.5 Tablet] 30 tablet 1    Sig: TAKE 1 TABLET BY MOUTH ONCE DAILY AS NEEDED FOR ANXIETY      Not Delegated - Psychiatry:  Anxiolytics/Hypnotics Failed - 01/21/2019  3:41 PM      Failed - This refill cannot be delegated      Failed - Urine Drug Screen completed in last 360 days.      Passed - Valid encounter within last 6 months    Recent Outpatient Visits           5 months ago GAD (generalized anxiety disorder)   [place] Cannady, Barbaraann Faster, NP   11 months ago Routine general medical examination at a health care facility   [place], NP   1 year ago Routine general medical examination at a health care facility   [place], NP   2 years ago Routine general medical examination at a health care facility   [place], NP   3 years ago Fever, unspecified fever cause   [facility] Kathrine Haddock, NP       Future Appointments             In 3 weeks Cannady, Barbaraann Faster, NP [facility]

## 2019-01-21 NOTE — Telephone Encounter (Signed)
Requested Prescriptions  Pending Prescriptions Disp Refills  . sildenafil (REVATIO) 20 MG tablet [Pharmacy Med Name: SILDENAFIL 20 MG TABLET] 90 tablet 0    Sig: TAKE 1-5 TABLETS BY MOUTH DAILY AS NEEDED     Urology: Erectile Dysfunction Agents Failed - 01/21/2019  3:40 PM      Failed - Last BP in normal range    BP Readings from Last 1 Encounters:  04/26/18 (!) 132/92         Passed - Valid encounter within last 12 months    Recent Outpatient Visits          5 months ago GAD (generalized anxiety disorder)   [place] Cannady, [place] T, NP   11 months ago Routine general medical examination at a health care facility   [place], NP   1 year ago Routine general medical examination at a health care facility   [facility] Kathrine Haddock, NP   2 years ago Routine general medical examination at a health care facility   [place], NP   3 years ago Fever, unspecified fever cause   [facility] Kathrine Haddock, NP      Future Appointments            In 3 weeks Cannady, Barbaraann Faster, NP [facility]

## 2019-01-22 NOTE — Telephone Encounter (Signed)
Patient stated he has enough to get to his appointment.

## 2019-02-11 ENCOUNTER — Ambulatory Visit (INDEPENDENT_AMBULATORY_CARE_PROVIDER_SITE_OTHER): Payer: 59 | Admitting: Nurse Practitioner

## 2019-02-11 ENCOUNTER — Other Ambulatory Visit: Payer: Self-pay

## 2019-02-11 ENCOUNTER — Encounter: Payer: Self-pay | Admitting: Nurse Practitioner

## 2019-02-11 VITALS — BP 120/81 | HR 52 | Temp 97.6°F | Ht 69.29 in | Wt 170.6 lb

## 2019-02-11 DIAGNOSIS — Z79899 Other long term (current) drug therapy: Secondary | ICD-10-CM

## 2019-02-11 DIAGNOSIS — Z0000 Encounter for general adult medical examination without abnormal findings: Secondary | ICD-10-CM | POA: Diagnosis not present

## 2019-02-11 DIAGNOSIS — N529 Male erectile dysfunction, unspecified: Secondary | ICD-10-CM | POA: Diagnosis not present

## 2019-02-11 DIAGNOSIS — J45990 Exercise induced bronchospasm: Secondary | ICD-10-CM | POA: Diagnosis not present

## 2019-02-11 DIAGNOSIS — F411 Generalized anxiety disorder: Secondary | ICD-10-CM | POA: Diagnosis not present

## 2019-02-11 MED ORDER — ALPRAZOLAM 0.5 MG PO TABS: ORAL_TABLET | ORAL | 1 refills | Status: DC

## 2019-02-11 MED ORDER — FINASTERIDE 1 MG PO TABS: 1.0000 mg | ORAL_TABLET | Freq: Every day | ORAL | 3 refills | Status: DC

## 2019-02-11 MED FILL — ALPRAZolam 0.5 MG TABS: 0.5 | 30 days supply | Qty: 30 | Fill #0

## 2019-02-11 NOTE — Assessment & Plan Note (Signed)
Chronic, ongoing.  Continue current medication regimen and adjust as needed.  Refills sent.    

## 2019-02-11 NOTE — Patient Instructions (Signed)
 Health Maintenance, Male Adopting a healthy lifestyle and getting preventive care are important in promoting health and wellness. Ask your health care provider about:  The right schedule for you to have regular tests and exams.  Things you can do on your own to prevent diseases and keep yourself healthy. What should I know about diet, weight, and exercise? Eat a healthy diet   Eat a diet that includes plenty of vegetables, fruits, low-fat dairy products, and lean protein.  Do not eat a lot of foods that are high in solid fats, added sugars, or sodium. Maintain a healthy weight Body mass index (BMI) is a measurement that can be used to identify possible weight problems. It estimates body fat based on height and weight. Your health care provider can help determine your BMI and help you achieve or maintain a healthy weight. Get regular exercise Get regular exercise. This is one of the most important things you can do for your health. Most adults should:  Exercise for at least 150 minutes each week. The exercise should increase your heart rate and make you sweat (moderate-intensity exercise).  Do strengthening exercises at least twice a week. This is in addition to the moderate-intensity exercise.  Spend less time sitting. Even light physical activity can be beneficial. Watch cholesterol and blood lipids Have your blood tested for lipids and cholesterol at 43 years of age, then have this test every 5 years. You may need to have your cholesterol levels checked more often if:  Your lipid or cholesterol levels are high.  You are older than 43 years of age.  You are at high risk for heart disease. What should I know about cancer screening? Many types of cancers can be detected early and may often be prevented. Depending on your health history and family history, you may need to have cancer screening at various ages. This may include screening for:  Colorectal cancer.  Prostate  cancer.  Skin cancer.  Lung cancer. What should I know about heart disease, diabetes, and high blood pressure? Blood pressure and heart disease  High blood pressure causes heart disease and increases the risk of stroke. This is more likely to develop in people who have high blood pressure readings, are of African descent, or are overweight.  Talk with your health care provider about your target blood pressure readings.  Have your blood pressure checked: ? Every 3-5 years if you are 18-39 years of age. ? Every year if you are 40 years old or older.  If you are between the ages of 65 and 75 and are a current or former smoker, ask your health care provider if you should have a one-time screening for abdominal aortic aneurysm (AAA). Diabetes Have regular diabetes screenings. This checks your fasting blood sugar level. Have the screening done:  Once every three years after age 45 if you are at a normal weight and have a low risk for diabetes.  More often and at a younger age if you are overweight or have a high risk for diabetes. What should I know about preventing infection? Hepatitis B If you have a higher risk for hepatitis B, you should be screened for this virus. Talk with your health care provider to find out if you are at risk for hepatitis B infection. Hepatitis C Blood testing is recommended for:  Everyone born from 1945 through 1965.  Anyone with known risk factors for hepatitis C. Sexually transmitted infections (STIs)  You should be screened each   year for STIs, including gonorrhea and chlamydia, if: ? You are sexually active and are younger than 43 years of age. ? You are older than 43 years of age and your health care provider tells you that you are at risk for this type of infection. ? Your sexual activity has changed since you were last screened, and you are at increased risk for chlamydia or gonorrhea. Ask your health care provider if you are at risk.  Ask your  health care provider about whether you are at high risk for HIV. Your health care provider may recommend a prescription medicine to help prevent HIV infection. If you choose to take medicine to prevent HIV, you should first get tested for HIV. You should then be tested every 3 months for as long as you are taking the medicine. Follow these instructions at home: Lifestyle  Do not use any products that contain nicotine or tobacco, such as cigarettes, e-cigarettes, and chewing tobacco. If you need help quitting, ask your health care provider.  Do not use street drugs.  Do not share needles.  Ask your health care provider for help if you need support or information about quitting drugs. Alcohol use  Do not drink alcohol if your health care provider tells you not to drink.  If you drink alcohol: ? Limit how much you have to 0-2 drinks a day. ? Be aware of how much alcohol is in your drink. In the [place] one drink equals one 12 oz bottle of beer (355 mL), one 5 oz glass of wine (148 mL), or one 1 oz glass of hard liquor (44 mL). General instructions  Schedule regular health, dental, and eye exams.  Stay current with your vaccines.  Tell your health care provider if: ? You often feel depressed. ? You have ever been abused or do not feel safe at home. Summary  Adopting a healthy lifestyle and getting preventive care are important in promoting health and wellness.  Follow your health care provider's instructions about healthy diet, exercising, and getting tested or screened for diseases.  Follow your health care provider's instructions on monitoring your cholesterol and blood pressure. This information is not intended to replace advice given to you by your health care provider. Make sure you discuss any questions you have with your health care provider. Document Revised: 01/10/2018 Document Reviewed: 01/10/2018 Elsevier Patient Education  2020 [employer] Inc.  American Heart Association  (AHA) Exercise Recommendation  Being physically active is important to prevent heart disease and stroke, the nation's No. 1and No. 5killers. To improve overall cardiovascular health, we suggest at least 150 minutes per week of moderate exercise or 75 minutes per week of vigorous exercise (or a combination of moderate and vigorous activity). Thirty minutes a day, five times a week is an easy goal to remember. You will also experience benefits even if you divide your time into two or three segments of 10 to 15 minutes per day.  For people who would benefit from lowering their blood pressure or cholesterol, we recommend 40 minutes of aerobic exercise of moderate to vigorous intensity three to four times a week to lower the risk for heart attack and stroke.  Physical activity is anything that makes you move your body and burn calories.  This includes things like climbing stairs or playing sports. Aerobic exercises benefit your heart, and include walking, jogging, swimming or biking. Strength and stretching exercises are best for overall stamina and flexibility.  The simplest, positive change you can   make to effectively improve your heart health is to start walking. It's enjoyable, free, easy, social and great exercise. A walking program is flexible and boasts high success rates because people can stick with it. It's easy for walking to become a regular and satisfying part of life.   For Overall Cardiovascular Health:  At least 30 minutes of moderate-intensity aerobic activity at least 5 days per week for a total of 150  OR   At least 25 minutes of vigorous aerobic activity at least 3 days per week for a total of 75 minutes; or a combination of moderate- and vigorous-intensity aerobic activity  AND   Moderate- to high-intensity muscle-strengthening activity at least 2 days per week for additional health benefits.  For Lowering Blood Pressure and Cholesterol  An average 40 minutes of moderate-  to vigorous-intensity aerobic activity 3 or 4 times per week  What if I can't make it to the time goal? Something is always better than nothing! And everyone has to start somewhere. Even if you've been sedentary for years, today is the day you can begin to make healthy changes in your life. If you don't think you'll make it for 30 or 40 minutes, set a reachable goal for today. You can work up toward your overall goal by increasing your time as you get stronger. Don't let all-or-nothing thinking rob you of doing what you can every day.  Source:http://www.heart.org    

## 2019-02-11 NOTE — Progress Notes (Signed)
BP 120/81 (BP Location: Left Arm, Patient Position: Sitting, Cuff Size: Normal)   Pulse (!) 52   Temp 97.6 F (36.4 C) (Oral)   Ht 5' 9.29" (1.76 m)   Wt 170 lb 9.6 oz (77.4 kg)   SpO2 98%   BMI 24.98 kg/m    Subjective:    Patient ID: Steven Rowland, male    DOB: January 17, 1976, 44 y.o.   MRN: [id number]  HPI: Steven Rowland is a 44 y.o. male presenting on 02/11/2019 for comprehensive medical examination. Current medical complaints include:none  He currently lives with: significant other Interim Problems from his last visit: no   ANXIETY/STRESS Continues on occasional Xanax with a prescription lasting 2-3 months.  Last fill 12/06/2018 for 30 tablets. Duration:stable Anxious mood: no  Excessive worrying: no Irritability: no  Sweating: no Nausea: no Palpitations:no Hyperventilation: no Panic attacks: no Agoraphobia: no  Obscessions/compulsions: no Depressed mood: no Depression screen [facility] 2/9 02/11/2019 08/08/2018 04/19/2017 02/24/2015  Decreased Interest 0 0 0 0  Down, Depressed, Hopeless 0 0 0 0  PHQ - 2 Score 0 0 0 0  Altered sleeping 0 0 - -  Tired, decreased energy 0 0 - -  Change in appetite 0 0 - -  Feeling bad or failure about yourself  0 0 - -  Trouble concentrating 0 0 - -  Moving slowly or fidgety/restless 0 0 - -  Suicidal thoughts 0 0 - -  PHQ-9 Score 0 0 - -  Difficult doing work/chores Not difficult at all Not difficult at all - -   Anhedonia: no Weight changes: no Insomnia: yes hard to fall asleep  Hypersomnia: no Fatigue/loss of energy: no Feelings of worthlessness: no Feelings of guilt: no Impaired concentration/indecisiveness: no Suicidal ideations: no  Crying spells: no Recent Stressors/Life Changes: no   Relationship problems: no   Family stress: no     Financial stress: no    Job stress: no    Recent death/loss: no GAD 7 : Generalized Anxiety Score 02/11/2019 08/08/2018  Nervous, Anxious, on Edge 0 0  Control/stop worrying 0 0  Worry too  much - different things 0 0  Trouble relaxing 0 1  Restless 0 0  Easily annoyed or irritable 0 0  Afraid - awful might happen 0 0  Total GAD 7 Score 0 1  Anxiety Difficulty Not difficult at all Not difficult at all   ASTHMA Asthma status: stable Satisfied with current treatment?: yes Albuterol/rescue inhaler frequency:  Dyspnea frequency:  Wheezing frequency: Cough frequency:  Nocturnal symptom frequency:  Limitation of activity: no Current upper respiratory symptoms: no Triggers:  Home peak flows: Last Spirometry:  Failed/intolerant to following asthma meds:  Asthma meds in past:  Aerochamber/spacer use: no Visits to ER or Urgent Care in past year: no Pneumovax: unknown Influenza: Up to Date  Functional Status Survey: Is the patient deaf or have difficulty hearing?: No Does the patient have difficulty seeing, even when wearing glasses/contacts?: No Does the patient have difficulty concentrating, remembering, or making decisions?: No Does the patient have difficulty walking or climbing stairs?: No Does the patient have difficulty dressing or bathing?: No Does the patient have difficulty doing errands alone such as visiting a doctor's office or shopping?: No  FALL RISK: Fall Risk  08/08/2018 04/19/2017 02/24/2015  Falls in the past year? 0 No No  Number falls in past yr: 0 - -  Injury with Fall? 0 - -  Follow up Falls evaluation completed - -  Depression Screen Depression screen [facility] 2/9 02/11/2019 08/08/2018 04/19/2017 02/24/2015  Decreased Interest 0 0 0 0  Down, Depressed, Hopeless 0 0 0 0  PHQ - 2 Score 0 0 0 0  Altered sleeping 0 0 - -  Tired, decreased energy 0 0 - -  Change in appetite 0 0 - -  Feeling bad or failure about yourself  0 0 - -  Trouble concentrating 0 0 - -  Moving slowly or fidgety/restless 0 0 - -  Suicidal thoughts 0 0 - -  PHQ-9 Score 0 0 - -  Difficult doing work/chores Not difficult at all Not difficult at all - -    Advanced  Directives <no information>  Past Medical History:  Past Medical History:  Diagnosis Date  . Asthma   . ED (erectile dysfunction)   . Hypogonadism in male     Surgical History:  Past Surgical History:  Procedure Laterality Date  . SURGERY SCROTAL / TESTICULAR  1982   undescended testicle    Medications:  Current Outpatient Medications on File Prior to Visit  Medication Sig  . albuterol (PROVENTIL HFA;VENTOLIN HFA) 108 (90 Base) MCG/ACT inhaler Inhale 2 puffs into the lungs every 4 (four) hours as needed.  . sildenafil (REVATIO) 20 MG tablet TAKE 1-5 TABLETS BY MOUTH DAILY AS NEEDED   No current facility-administered medications on file prior to visit.    Allergies:  No Known Allergies  Social History:  Social History   Socioeconomic History  . Marital status: Married    Spouse name: Not on file  . Number of children: Not on file  . Years of education: Not on file  . Highest education level: Not on file  Occupational History  . Not on file  Tobacco Use  . Smoking status: Never Smoker  . Smokeless tobacco: Never Used  Substance and Sexual Activity  . Alcohol use: Yes    Alcohol/week: 1.0 - 2.0 standard drinks    Types: 1 - 2 Cans of beer per week  . Drug use: No  . Sexual activity: Yes  Other Topics Concern  . Not on file  Social History Narrative  . Not on file   Social Determinants of Health   Financial Resource Strain:   . Difficulty of Paying Living Expenses: Not on file  Food Insecurity:   . Worried About Charity fundraiser in the Last Year: Not on file  . Ran Out of Food in the Last Year: Not on file  Transportation Needs:   . Lack of Transportation (Medical): Not on file  . Lack of Transportation (Non-Medical): Not on file  Physical Activity:   . Days of Exercise per Week: Not on file  . Minutes of Exercise per Session: Not on file  Stress:   . Feeling of Stress : Not on file  Social Connections:   . Frequency of Communication with Friends  and Family: Not on file  . Frequency of Social Gatherings with Friends and Family: Not on file  . Attends Religious Services: Not on file  . Active Member of Clubs or Organizations: Not on file  . Attends Archivist Meetings: Not on file  . Marital Status: Not on file  Intimate Partner Violence:   . Fear of Current or Ex-Partner: Not on file  . Emotionally Abused: Not on file  . Physically Abused: Not on file  . Sexually Abused: Not on file   Social History   Tobacco Use  Smoking Status  Never Smoker  Smokeless Tobacco Never Used   Social History   Substance and Sexual Activity  Alcohol Use Yes  . Alcohol/week: 1.0 - 2.0 standard drinks  . Types: 1 - 2 Cans of beer per week    Family History:  Family History  Problem Relation Age of Onset  . Arthritis Mother   . Stroke Father   . Hyperlipidemia Father   . Hypertension Father   . Kidney Stones Father   . Asthma Brother   . Thyroid disease Brother   . Eczema Son   . Cancer Maternal Grandmother   . Cancer Maternal Grandfather   . Kidney disease Neg Hx   . Prostate cancer Neg Hx     Past medical history, surgical history, medications, allergies, family history and social history reviewed with patient today and changes made to appropriate areas of the chart.   Review of Systems - negative All other ROS negative except what is listed above and in the HPI.      Objective:    BP 120/81 (BP Location: Left Arm, Patient Position: Sitting, Cuff Size: Normal)   Pulse (!) 52   Temp 97.6 F (36.4 C) (Oral)   Ht 5' 9.29" (1.76 m)   Wt 170 lb 9.6 oz (77.4 kg)   SpO2 98%   BMI 24.98 kg/m   Wt Readings from Last 3 Encounters:  02/11/19 170 lb 9.6 oz (77.4 kg)  04/26/18 162 lb (73.5 kg)  03/14/18 161 lb (73 kg)    Physical Exam Vitals and nursing note reviewed.  Constitutional:      General: He is awake. He is not in acute distress.    Appearance: He is well-developed. He is not ill-appearing.  HENT:      Head: Normocephalic and atraumatic.     Right Ear: Hearing, tympanic membrane, ear canal and external ear normal. No drainage.     Left Ear: Hearing, tympanic membrane, ear canal and external ear normal. No drainage.     Nose: Nose normal.     Mouth/Throat:     Pharynx: Uvula midline.  Eyes:     General: Lids are normal.        Right eye: No discharge.        Left eye: No discharge.     Extraocular Movements: Extraocular movements intact.     Conjunctiva/sclera: Conjunctivae normal.     Pupils: Pupils are equal, round, and reactive to light.     Visual Fields: Right eye visual fields normal and left eye visual fields normal.  Neck:     Thyroid: No thyromegaly.     Vascular: No carotid bruit or JVD.     Trachea: Trachea normal.  Cardiovascular:     Rate and Rhythm: Normal rate and regular rhythm.     Heart sounds: Normal heart sounds, S1 normal and S2 normal. No murmur. No gallop.   Pulmonary:     Effort: Pulmonary effort is normal. No accessory muscle usage or respiratory distress.     Breath sounds: Normal breath sounds.  Abdominal:     General: Bowel sounds are normal.     Palpations: Abdomen is soft. There is no hepatomegaly or splenomegaly.     Tenderness: There is no abdominal tenderness.  Genitourinary:    Comments: Deferred per patient request  Musculoskeletal:        General: Normal range of motion.     Cervical back: Normal range of motion and neck supple.     Right  lower leg: No edema.     Left lower leg: No edema.  Lymphadenopathy:     Head:     Right side of head: No submental, submandibular, tonsillar, preauricular or posterior auricular adenopathy.     Left side of head: No submental, submandibular, tonsillar, preauricular or posterior auricular adenopathy.     Cervical: No cervical adenopathy.  Skin:    General: Skin is warm and dry.     Capillary Refill: Capillary refill takes less than 2 seconds.     Findings: No rash.  Neurological:     Mental Status:  He is alert and oriented to person, place, and time.     Cranial Nerves: Cranial nerves are intact.     Gait: Gait is intact.     Deep Tendon Reflexes: Reflexes are normal and symmetric.     Reflex Scores:      Brachioradialis reflexes are 2+ on the right side and 2+ on the left side.      Patellar reflexes are 2+ on the right side and 2+ on the left side. Psychiatric:        Attention and Perception: Attention normal.        Mood and Affect: Mood normal.        Speech: Speech normal.        Behavior: Behavior normal. Behavior is cooperative.        Thought Content: Thought content normal.        Cognition and Memory: Cognition normal.        Judgment: Judgment normal.     Results for orders placed or performed in visit on 02/16/18  PSA  Result Value Ref Range   Prostate Specific Ag, Serum 0.6 0.0 - 4.0 ng/mL  TSH  Result Value Ref Range   TSH 1.970 0.450 - 4.500 uIU/mL  CBC with Differential/Platelet  Result Value Ref Range   WBC 4.5 3.4 - 10.8 x10E3/uL   RBC 5.18 4.14 - 5.80 x10E6/uL   Hemoglobin 15.0 13.0 - 17.7 g/dL   Hematocrit 43.8 37.5 - 51.0 %   MCV 85 79 - 97 fL   MCH 29.0 26.6 - 33.0 pg   MCHC 34.2 31.5 - 35.7 g/dL   RDW 12.0 11.6 - 15.4 %   Platelets 235 150 - 450 x10E3/uL   Neutrophils 54 Not Estab. %   Lymphs 33 Not Estab. %   Monocytes 10 Not Estab. %   Eos 2 Not Estab. %   Basos 1 Not Estab. %   Neutrophils Absolute 2.5 1.4 - 7.0 x10E3/uL   Lymphocytes Absolute 1.5 0.7 - 3.1 x10E3/uL   Monocytes Absolute 0.5 0.1 - 0.9 x10E3/uL   EOS (ABSOLUTE) 0.1 0.0 - 0.4 x10E3/uL   Basophils Absolute 0.0 0.0 - 0.2 x10E3/uL   Immature Granulocytes 0 Not Estab. %   Immature Grans (Abs) 0.0 0.0 - 0.1 x10E3/uL  Comprehensive metabolic panel  Result Value Ref Range   Glucose 72 65 - 99 mg/dL   BUN 27 (H) 6 - 24 mg/dL   Creatinine, Ser 0.95 0.76 - 1.27 mg/dL   GFR calc non Af Amer 99 >59 mL/min/1.73   GFR calc Af Amer 114 >59 mL/min/1.73   BUN/Creatinine Ratio 28 (H)  9 - 20   Sodium 139 134 - 144 mmol/L   Potassium 4.9 3.5 - 5.2 mmol/L   Chloride 101 96 - 106 mmol/L   CO2 20 20 - 29 mmol/L   Calcium 9.3 8.7 - 10.2 mg/dL  Total Protein 6.9 6.0 - 8.5 g/dL   Albumin 4.6 3.5 - 5.5 g/dL   Globulin, Total 2.3 1.5 - 4.5 g/dL   Albumin/Globulin Ratio 2.0 1.2 - 2.2   Bilirubin Total 0.5 0.0 - 1.2 mg/dL   Alkaline Phosphatase 53 39 - 117 IU/L   AST 23 0 - 40 IU/L   ALT 23 0 - 44 IU/L  Lipid Panel w/o Chol/HDL Ratio  Result Value Ref Range   Cholesterol, Total 164 100 - 199 mg/dL   Triglycerides 65 0 - 149 mg/dL   HDL 50 >39 mg/dL   VLDL Cholesterol Cal 13 5 - 40 mg/dL   LDL Calculated 101 (H) 0 - 99 mg/dL      Assessment & Plan:   Problem List Items Addressed This Visit      Respiratory   Exercise-induced asthma    Chronic, stable with minimal use of Albuterol.  Needs no refills today.  Continue to monitor and adjust regimen as needed.        Other   ED (erectile dysfunction)    Chronic, ongoing.  Continue current medication regimen and adjust as needed.  Refills sent.      GAD (generalized anxiety disorder)    Chronic, stable.  Minimally uses Xanax.  Continue current medication regimen and recommend reduction and switch to SSRI if ongoing issues.  Return in 6 months.  Refills sent.  PMP reviewed.      Relevant Medications   ALPRAZolam (XANAX) 0.5 MG tablet    Other Visit Diagnoses    Annual physical exam    -  Primary   Relevant Orders   CBC with Differential   Comprehensive metabolic panel   Lipid panel   TSH   PSA   Long-term current use of benzodiazepine       Relevant Orders   Urine drugs of abuse scrn w alc, routine (Ref Lab)      Discussed aspirin prophylaxis for myocardial infarction prevention and decision was it was not indicated  LABORATORY TESTING:  Health maintenance labs ordered today as discussed above.   The natural history of prostate cancer and ongoing controversy regarding screening and potential treatment  outcomes of prostate cancer has been discussed with the patient. The meaning of a false positive PSA and a false negative PSA has been discussed. He indicates understanding of the limitations of this screening test and wishes to proceed with screening PSA testing.   IMMUNIZATIONS:   - Tdap: Tetanus vaccination status reviewed: last tetanus booster within 10 years. - Influenza: Up to date - Pneumovax: Not applicable - Prevnar: Not applicable - Zostavax vaccine: Refused  SCREENING: - Colonoscopy: Not applicable  Discussed with patient purpose of the colonoscopy is to detect colon cancer at curable precancerous or early stages   - AAA Screening: Not applicable  -Hearing Test: Not applicable  -Spirometry: Not applicable   PATIENT COUNSELING:    Sexuality: Discussed sexually transmitted diseases, partner selection, use of condoms, avoidance of unintended pregnancy  and contraceptive alternatives.   Advised to avoid cigarette smoking.  I discussed with the patient that most people either abstain from alcohol or drink within safe limits (<=14/week and <=4 drinks/occasion for males, <=7/weeks and <= 3 drinks/occasion for females) and that the risk for alcohol disorders and other health effects rises proportionally with the number of drinks per week and how often a drinker exceeds daily limits.  Discussed cessation/primary prevention of drug use and availability of treatment for abuse.   Diet:  Encouraged to adjust caloric intake to maintain  or achieve ideal body weight, to reduce intake of dietary saturated fat and total fat, to limit sodium intake by avoiding high sodium foods and not adding table salt, and to maintain adequate dietary potassium and calcium preferably from fresh fruits, vegetables, and low-fat dairy products.    stressed the importance of regular exercise  Injury prevention: Discussed safety belts, safety helmets, smoke detector, smoking near bedding or upholstery.    Dental health: Discussed importance of regular tooth brushing, flossing, and dental visits.   Follow up plan: NEXT PREVENTATIVE PHYSICAL DUE IN 1 YEAR. Return in about 6 months (around 08/11/2019) for anxiety.   Controlled substance.  Checked data base and no other refills of controlled substances noted, last fill 12/06/2018 of Xanax.

## 2019-02-11 NOTE — Assessment & Plan Note (Signed)
Chronic, stable with minimal use of Albuterol.  Needs no refills today.  Continue to monitor and adjust regimen as needed.

## 2019-02-11 NOTE — Assessment & Plan Note (Signed)
Chronic, stable.  Minimally uses Xanax.  Continue current medication regimen and recommend reduction and switch to SSRI if ongoing issues.  Return in 6 months.  Refills sent.  PMP reviewed.

## 2019-02-12 LAB — PSA: Prostate Specific Ag, Serum: 0.4 ng/mL (ref 0.0–4.0)

## 2019-02-12 LAB — CBC WITH DIFFERENTIAL/PLATELET
Basophils Absolute: 0 10*3/uL (ref 0.0–0.2)
Basos: 1 %
EOS (ABSOLUTE): 0.2 10*3/uL (ref 0.0–0.4)
Eos: 4 %
Hematocrit: 45.6 % (ref 37.5–51.0)
Hemoglobin: 15.1 g/dL (ref 13.0–17.7)
Immature Grans (Abs): 0 10*3/uL (ref 0.0–0.1)
Immature Granulocytes: 0 %
Lymphocytes Absolute: 1.5 10*3/uL (ref 0.7–3.1)
Lymphs: 37 %
MCH: 29.8 pg (ref 26.6–33.0)
MCHC: 33.1 g/dL (ref 31.5–35.7)
MCV: 90 fL (ref 79–97)
Monocytes Absolute: 0.5 10*3/uL (ref 0.1–0.9)
Monocytes: 11 %
Neutrophils Absolute: 2 10*3/uL (ref 1.4–7.0)
Neutrophils: 47 %
Platelets: 212 10*3/uL (ref 150–450)
RBC: 5.07 x10E6/uL (ref 4.14–5.80)
RDW: 11.9 % (ref 11.6–15.4)
WBC: 4.1 10*3/uL (ref 3.4–10.8)

## 2019-02-12 LAB — COMPREHENSIVE METABOLIC PANEL
ALT: 29 IU/L (ref 0–44)
AST: 23 IU/L (ref 0–40)
Albumin/Globulin Ratio: 2 (ref 1.2–2.2)
Albumin: 4.6 g/dL (ref 4.0–5.0)
Alkaline Phosphatase: 56 IU/L (ref 39–117)
BUN/Creatinine Ratio: 22 — ABNORMAL HIGH (ref 9–20)
BUN: 22 mg/dL (ref 6–24)
Bilirubin Total: 0.6 mg/dL (ref 0.0–1.2)
CO2: 21 mmol/L (ref 20–29)
Calcium: 9.1 mg/dL (ref 8.7–10.2)
Chloride: 102 mmol/L (ref 96–106)
Creatinine, Ser: 1.02 mg/dL (ref 0.76–1.27)
GFR calc Af Amer: 104 mL/min/{1.73_m2} (ref >59)
GFR calc non Af Amer: 90 mL/min/{1.73_m2} (ref >59)
Globulin, Total: 2.3 g/dL (ref 1.5–4.5)
Glucose: 83 mg/dL (ref 65–99)
Potassium: 4.3 mmol/L (ref 3.5–5.2)
Sodium: 139 mmol/L (ref 134–144)
Total Protein: 6.9 g/dL (ref 6.0–8.5)

## 2019-02-12 LAB — LIPID PANEL
Chol/HDL Ratio: 3 ratio (ref 0.0–5.0)
Cholesterol, Total: 179 mg/dL (ref 100–199)
HDL: 59 mg/dL (ref >39)
LDL Chol Calc (NIH): 106 mg/dL — ABNORMAL HIGH (ref 0–99)
Triglycerides: 74 mg/dL (ref 0–149)
VLDL Cholesterol Cal: 14 mg/dL (ref 5–40)

## 2019-02-12 LAB — URINE DRUGS OF ABUSE SCREEN W ALC, ROUTINE (REF LAB)
Amphetamines, Urine: NEGATIVE ng/mL
Barbiturate Quant, Ur: NEGATIVE ng/mL
Benzodiazepine Quant, Ur: NEGATIVE ng/mL
Cannabinoid Quant, Ur: NEGATIVE ng/mL
Cocaine (Metab.): NEGATIVE ng/mL
Ethanol, Urine: NEGATIVE %
Methadone Screen, Urine: NEGATIVE ng/mL
Opiate Quant, Ur: NEGATIVE ng/mL
PCP Quant, Ur: NEGATIVE ng/mL
Propoxyphene: NEGATIVE ng/mL

## 2019-02-12 LAB — TSH: TSH: 2.88 u[IU]/mL (ref 0.450–4.500)

## 2019-02-12 NOTE — Progress Notes (Signed)
Contacted via MyChart

## 2019-03-14 ENCOUNTER — Other Ambulatory Visit: Payer: Self-pay | Admitting: Nurse Practitioner

## 2019-03-14 DIAGNOSIS — D2262 Melanocytic nevi of left upper limb, including shoulder: Secondary | ICD-10-CM | POA: Diagnosis not present

## 2019-03-14 DIAGNOSIS — D2271 Melanocytic nevi of right lower limb, including hip: Secondary | ICD-10-CM | POA: Diagnosis not present

## 2019-03-14 DIAGNOSIS — L648 Other androgenic alopecia: Secondary | ICD-10-CM | POA: Diagnosis not present

## 2019-03-14 DIAGNOSIS — D2272 Melanocytic nevi of left lower limb, including hip: Secondary | ICD-10-CM | POA: Diagnosis not present

## 2019-03-14 DIAGNOSIS — L821 Other seborrheic keratosis: Secondary | ICD-10-CM | POA: Diagnosis not present

## 2019-03-14 DIAGNOSIS — D2261 Melanocytic nevi of right upper limb, including shoulder: Secondary | ICD-10-CM | POA: Diagnosis not present

## 2019-03-14 DIAGNOSIS — D225 Melanocytic nevi of trunk: Secondary | ICD-10-CM | POA: Diagnosis not present

## 2019-03-19 ENCOUNTER — Encounter: Payer: Self-pay | Admitting: Nurse Practitioner

## 2019-03-20 ENCOUNTER — Other Ambulatory Visit: Payer: Self-pay | Admitting: Nurse Practitioner

## 2019-03-20 MED ORDER — ALPRAZOLAM 0.5 MG PO TABS: ORAL_TABLET | ORAL | 2 refills | Status: DC

## 2019-04-09 ENCOUNTER — Other Ambulatory Visit: Payer: Self-pay | Admitting: Unknown Physician Specialty

## 2019-04-09 ENCOUNTER — Other Ambulatory Visit: Payer: Self-pay | Admitting: Nurse Practitioner

## 2019-04-10 ENCOUNTER — Other Ambulatory Visit: Payer: Self-pay | Admitting: Nurse Practitioner

## 2019-05-15 ENCOUNTER — Other Ambulatory Visit: Payer: Self-pay | Admitting: Nurse Practitioner

## 2019-05-29 ENCOUNTER — Encounter: Payer: Self-pay | Admitting: Emergency Medicine

## 2019-05-29 ENCOUNTER — Ambulatory Visit
Admission: EM | Admit: 2019-05-29 | Discharge: 2019-05-29 | Disposition: A | Discharge status: Home / Self Care | Payer: 59 | Attending: Emergency Medicine | Admitting: Emergency Medicine

## 2019-05-29 ENCOUNTER — Ambulatory Visit (INDEPENDENT_AMBULATORY_CARE_PROVIDER_SITE_OTHER)
Admission: RE | Admit: 2019-05-29 | Discharge: 2019-05-29 | Disposition: A | Discharge status: Other Acute Inpatient Hospital | Payer: 59 | Source: Ambulatory Visit

## 2019-05-29 ENCOUNTER — Other Ambulatory Visit: Payer: Self-pay

## 2019-05-29 DIAGNOSIS — L03012 Cellulitis of left finger: Secondary | ICD-10-CM | POA: Diagnosis not present

## 2019-05-29 DIAGNOSIS — M79645 Pain in left finger(s): Secondary | ICD-10-CM

## 2019-05-29 MED ORDER — CEPHALEXIN 500 MG PO CAPS: 500.0000 mg | ORAL_CAPSULE | Freq: Four times a day (QID) | ORAL | 0 refills | Status: AC

## 2019-05-29 NOTE — ED Triage Notes (Signed)
Patient in office [facility] for poss infected left index finger x 1wk. Per patient has gotten worse the last 2-3 days with swelling. Warm to touch   [id number]  Denies: Fever

## 2019-05-29 NOTE — Discharge Instructions (Addendum)
Take the antibiotic as directed.    Keep your wound clean and dry.  Wash it gently twice a day with soap and water.  Apply an antibiotic cream twice a day.    Follow up with your PCP or return here if you see signs of infection, such as increased pain, redness, pus-like drainage, warmth, fever, chills, or other concerning symptoms.

## 2019-05-29 NOTE — ED Provider Notes (Signed)
Virtual Visit via Video Note:  Steven Rowland  initiated request for Telemedicine visit with [facility] Urgent [facility] team. I connected with Steven Rowland  on 05/29/2019 at 9:44 AM  for a synchronized telemedicine visit using a video enabled HIPPA compliant telemedicine application. I verified that I am speaking with Steven Rowland  using two identifiers. Sharion Balloon, NP  was physically located in a [facility] Urgent care site and Noey Steines was located at a different location.   The limitations of evaluation and management by telemedicine as well as the availability of in-person appointments were discussed. Patient was informed that he  may incur a bill ( including co-pay) for this virtual visit encounter. Steven Rowland  expressed understanding and gave verbal consent to proceed with virtual visit.     History of Present Illness:Steven Rowland  is a 44 y.o. male presents for evaluation of redness, swelling, and tenderness to his left index finger tip beside his fingernail.  No known injury.     No Known Allergies   Past Medical History:  Diagnosis Date  . Asthma   . ED (erectile dysfunction)   . Hypogonadism in male      Social History   Tobacco Use  . Smoking status: Never Smoker  . Smokeless tobacco: Never Used  Substance Use Topics  . Alcohol use: Yes    Alcohol/week: 1.0 - 2.0 standard drinks    Types: 1 - 2 Cans of beer per week  . Drug use: No        Observations/Objective: Physical Exam  Left index finger appears mildly edematous and erythematous beside fingernail.     Assessment and Plan:    ICD-10-CM   1. Pain of finger of left hand  M79.645        Follow Up Instructions: Patient will come to the urgent care for evaluation of his finger which is likely a paronychia.      I discussed the assessment and treatment plan with the patient. The patient was provided an opportunity to ask questions and all were answered. The patient agreed  with the plan and demonstrated an understanding of the instructions.   The patient was advised to call back or seek an in-person evaluation if the symptoms worsen or if the condition fails to improve as anticipated.      Sharion Balloon, NP  05/29/2019 9:44 AM         Sharion Balloon, NP 05/29/19 [phone]

## 2019-05-29 NOTE — ED Provider Notes (Addendum)
Roderic Palau    CSN: [id number] Arrival date & time: 05/29/19  1339      History   Chief Complaint Chief Complaint  Patient presents with  . Left index finger infected    HPI Eilert Ocheltree is a 44 y.o. male.   Patient presents with redness, swelling, tenderness on his left index fingertip beside his fingernail x 1 week.  He believes it is an infection from clipping his nail too short.  He denies fever, chills, drainage, numbness, weakness, or other symptoms.  Symptoms treated at home with NSAID.   Tetanus UTD.   The history is provided by the patient.    Past Medical History:  Diagnosis Date  . Asthma   . ED (erectile dysfunction)   . Hypogonadism in male     Patient Active Problem List   Diagnosis Date Noted  . GAD (generalized anxiety disorder) 08/05/2018  . Exercise-induced asthma 08/20/2014  . ED (erectile dysfunction) 08/20/2014    Past Surgical History:  Procedure Laterality Date  . SURGERY SCROTAL / TESTICULAR  1982   undescended testicle       Home Medications    Prior to Admission medications   Medication Sig Start Date End Date Taking? Authorizing Provider  albuterol (PROVENTIL HFA;VENTOLIN HFA) 108 (90 Base) MCG/ACT inhaler Inhale 2 puffs into the lungs every 4 (four) hours as needed. 03/14/18   Darlin Priestly, PA-C  ALPRAZolam Duanne Moron) 0.5 MG tablet Take 1 prn daily anxiety 03/20/19   Cannady, Henrine Screws T, NP  cephALEXin (KEFLEX) 500 MG capsule Take 1 capsule (500 mg total) by mouth 4 (four) times daily for 7 days. 05/29/19 06/05/19  Sharion Balloon, NP  finasteride (PROPECIA) 1 MG tablet TAKE 1 TABLET BY MOUTH DAILY 04/09/19   Cannady, Henrine Screws T, NP  sildenafil (REVATIO) 20 MG tablet TAKE ONE TO FIVE TABLETS BY MOUTH DAILY AS NEEDED 05/15/19   Venita Lick, NP    Family History Family History  Problem Relation Age of Onset  . Arthritis Mother   . Stroke Father   . Hyperlipidemia Father   . Hypertension Father   . Kidney Stones Father     . Asthma Brother   . Thyroid disease Brother   . Eczema Son   . Cancer Maternal Grandmother   . Cancer Maternal Grandfather   . Kidney disease Neg Hx   . Prostate cancer Neg Hx     Social History Social History   Tobacco Use  . Smoking status: Never Smoker  . Smokeless tobacco: Never Used  Substance Use Topics  . Alcohol use: Yes    Alcohol/week: 1.0 - 2.0 standard drinks    Types: 1 - 2 Cans of beer per week  . Drug use: No     Allergies   Patient has no known allergies.   Review of Systems Review of Systems  Constitutional: Negative for chills and fever.  HENT: Negative for ear pain and sore throat.   Eyes: Negative for pain and visual disturbance.  Respiratory: Negative for cough and shortness of breath.   Cardiovascular: Negative for chest pain and palpitations.  Gastrointestinal: Negative for abdominal pain and vomiting.  Genitourinary: Negative for dysuria and hematuria.  Musculoskeletal: Negative for arthralgias and back pain.  Skin: Positive for color change. Negative for rash.  Neurological: Negative for seizures, syncope, weakness and numbness.  All other systems reviewed and are negative.    Physical Exam Triage Vital Signs ED Triage Vitals  Enc Vitals Group  BP      Pulse      Resp      Temp      Temp src      SpO2      Weight      Height      Head Circumference      Peak Flow      Pain Score      Pain Loc      Pain Edu?      Excl. in [place]?    No data found.  Updated Vital Signs BP 138/83 (BP Location: Right Arm)   Pulse 61   Temp 98.5 F (36.9 C) (Oral)   Resp 18   Wt 159 lb (72.1 kg)   SpO2 96%   BMI 23.28 kg/m   Visual Acuity Right Eye Distance:   Left Eye Distance:   Bilateral Distance:    Right Eye Near:   Left Eye Near:    Bilateral Near:     Physical Exam Vitals and nursing note reviewed.  Constitutional:      General: He is not in acute distress.    Appearance: He is well-developed.  HENT:     Head:  Normocephalic and atraumatic.     Mouth/Throat:     Mouth: Mucous membranes are moist.  Eyes:     Conjunctiva/sclera: Conjunctivae normal.  Cardiovascular:     Rate and Rhythm: Normal rate and regular rhythm.     Heart sounds: No murmur.  Pulmonary:     Effort: Pulmonary effort is normal. No respiratory distress.     Breath sounds: Normal breath sounds.  Abdominal:     Palpations: Abdomen is soft.     Tenderness: There is no abdominal tenderness.  Musculoskeletal:     Cervical back: Neck supple.  Skin:    General: Skin is warm and dry.     Comments: Left index finger: tender, mildly erythematous, mildly edematous beside fingernail.  No open wound or drainage.    Neurological:     General: No focal deficit present.     Mental Status: He is alert and oriented to person, place, and time.     Sensory: No sensory deficit.     Motor: No weakness.  Psychiatric:        Mood and Affect: Mood normal.        Behavior: Behavior normal.      UC Treatments / Results  Labs (all labs ordered are listed, but only abnormal results are displayed) Labs Reviewed - No data to display  EKG   Radiology No results found.  Procedures Incision and Drainage  Date/Time: 05/29/2019 1:43 PM Performed by: Sharion Balloon, NP Authorized by: Sharion Balloon, NP   Consent:    Consent obtained:  Verbal   Consent given by:  Patient   Risks discussed:  Bleeding, pain, incomplete drainage and infection Location:    Location:  Upper extremity   Upper extremity location:  Finger   Finger location:  L index finger Pre-procedure details:    Skin preparation:  Antiseptic wash Anesthesia (see MAR for exact dosages):    Anesthesia method:  Local infiltration   Local anesthetic:  Lidocaine 1% w/o epi Procedure details:    Incision types:  Stab incision   Scalpel blade:  11   Drainage:  Bloody   Drainage amount:  Moderate   Packing materials:  None Post-procedure details:    Patient tolerance of  procedure:  Tolerated well, no immediate complications   (  including critical care time)  Medications Ordered in UC Medications - No data to display  Initial Impression / Assessment and Plan / UC Course  I have reviewed the triage vital signs and the nursing notes.  Pertinent labs & imaging results that were available during my care of the patient were reviewed by me and considered in my medical decision making (see chart for details).   Paronychia of left index finger.  I&D performed.  Treating with Keflex.  Wound care instructions and signs of worsening infection discussed with patient.  Instructed him to follow-up with his PCP or return here if he notes signs of worsening infection.  Patient agrees to plan of care.     Final Clinical Impressions(s) / UC Diagnoses   Final diagnoses:  Paronychia of left index finger     Discharge Instructions     Take the antibiotic as directed.    Keep your wound clean and dry.  Wash it gently twice a day with soap and water.  Apply an antibiotic cream twice a day.    Follow up with your PCP or return here if you see signs of infection, such as increased pain, redness, pus-like drainage, warmth, fever, chills, or other concerning symptoms.         ED Prescriptions    Medication Sig Dispense Auth. Provider   cephALEXin (KEFLEX) 500 MG capsule Take 1 capsule (500 mg total) by mouth 4 (four) times daily for 7 days. 28 capsule Sharion Balloon, NP     PDMP not reviewed this encounter.   Sharion Balloon, NP 05/29/19 1410    Sharion Balloon, NP 05/29/19 [phone]

## 2019-05-29 NOTE — Discharge Instructions (Addendum)
Come to the urgent care for in person evaluation of your finger.

## 2019-06-13 ENCOUNTER — Other Ambulatory Visit: Payer: Self-pay | Admitting: Nurse Practitioner

## 2019-06-13 NOTE — Telephone Encounter (Signed)
Requested Prescriptions  Pending Prescriptions Disp Refills  . sildenafil (REVATIO) 20 MG tablet [Pharmacy Med Name: SILDENAFIL 20 MG TABLET] 90 tablet 1    Sig: TAKE 1-5 TABLETS BY MOUTH DAILY AS NEEDED     Urology: Erectile Dysfunction Agents Passed - 06/13/2019  3:39 PM      Passed - Last BP in normal range    BP Readings from Last 1 Encounters:  05/29/19 138/83         Passed - Valid encounter within last 12 months    Recent Outpatient Visits          4 months ago Annual physical exam   [place], Henrine Screws T, NP   10 months ago GAD (generalized anxiety disorder)   [place] Cannady, Henrine Screws T, NP   1 year ago Routine general medical examination at a health care facility   [facility] Kathrine Haddock, NP   2 years ago Routine general medical examination at a health care facility   [facility] Kathrine Haddock, NP   3 years ago Routine general medical examination at a health care facility   [place], NP      Future Appointments            In 2 months Cannady, Barbaraann Faster, NP [employer]

## 2019-08-19 ENCOUNTER — Ambulatory Visit: Payer: 59 | Admitting: Nurse Practitioner

## 2019-08-27 ENCOUNTER — Other Ambulatory Visit: Payer: Self-pay | Admitting: Nurse Practitioner

## 2019-08-27 NOTE — Telephone Encounter (Signed)
Requested Prescriptions  Pending Prescriptions Disp Refills   sildenafil (REVATIO) 20 MG tablet [Pharmacy Med Name: SILDENAFIL 20 MG TABLET] 90 tablet 0    Sig: TAKE 1-5 TABLETS BY MOUTH DAILY AS NEEDED     Urology: Erectile Dysfunction Agents Passed - 08/27/2019  7:20 PM      Passed - Last BP in normal range    BP Readings from Last 1 Encounters:  05/29/19 138/83         Passed - Valid encounter within last 12 months    Recent Outpatient Visits          6 months ago Annual physical exam   [place], Henrine Screws T, NP   1 year ago GAD (generalized anxiety disorder)   [place] Cannady, Barbaraann Faster, NP   1 year ago Routine general medical examination at a health care facility   [facility] Kathrine Haddock, NP   2 years ago Routine general medical examination at a health care facility   [place], NP   3 years ago Routine general medical examination at a health care facility   [place], NP

## 2019-11-15 ENCOUNTER — Other Ambulatory Visit: Payer: Self-pay | Admitting: Nurse Practitioner

## 2019-11-19 ENCOUNTER — Other Ambulatory Visit: Payer: Self-pay | Admitting: Nurse Practitioner

## 2019-12-05 DIAGNOSIS — H5213 Myopia, bilateral: Secondary | ICD-10-CM | POA: Diagnosis not present

## 2020-01-08 ENCOUNTER — Other Ambulatory Visit: Payer: Self-pay | Admitting: Nurse Practitioner

## 2020-02-19 ENCOUNTER — Other Ambulatory Visit: Payer: Self-pay | Admitting: Nurse Practitioner

## 2020-03-03 ENCOUNTER — Other Ambulatory Visit: Payer: Self-pay

## 2020-03-03 ENCOUNTER — Other Ambulatory Visit: Payer: Self-pay | Admitting: Nurse Practitioner

## 2020-03-03 ENCOUNTER — Encounter: Payer: Self-pay | Admitting: Nurse Practitioner

## 2020-03-03 ENCOUNTER — Ambulatory Visit (INDEPENDENT_AMBULATORY_CARE_PROVIDER_SITE_OTHER): Payer: 59 | Admitting: Nurse Practitioner

## 2020-03-03 VITALS — BP 134/81 | HR 81 | Temp 98.5°F | Resp 16 | Ht 69.21 in | Wt 163.6 lb

## 2020-03-03 DIAGNOSIS — J45990 Exercise induced bronchospasm: Secondary | ICD-10-CM | POA: Diagnosis not present

## 2020-03-03 DIAGNOSIS — Z0000 Encounter for general adult medical examination without abnormal findings: Secondary | ICD-10-CM | POA: Diagnosis not present

## 2020-03-03 DIAGNOSIS — F411 Generalized anxiety disorder: Secondary | ICD-10-CM

## 2020-03-03 DIAGNOSIS — N529 Male erectile dysfunction, unspecified: Secondary | ICD-10-CM

## 2020-03-03 MED ORDER — SILDENAFIL CITRATE 20 MG PO TABS: ORAL_TABLET | ORAL | 0 refills | Status: DC

## 2020-03-03 MED ORDER — FINASTERIDE 1 MG PO TABS: 1.0000 mg | ORAL_TABLET | Freq: Every day | ORAL | 4 refills | Status: DC

## 2020-03-03 MED ORDER — ALPRAZOLAM 0.5 MG PO TABS: ORAL_TABLET | ORAL | 2 refills | Status: DC

## 2020-03-03 NOTE — Assessment & Plan Note (Signed)
Chronic, ongoing.  Continue current medication regimen and adjust as needed.  Refills sent.    

## 2020-03-03 NOTE — Patient Instructions (Signed)
Healthy Eating Following a healthy eating pattern may help you to achieve and maintain a healthy body weight, reduce the risk of chronic disease, and live a long and productive life. It is important to follow a healthy eating pattern at an appropriate calorie level for your body. Your nutritional needs should be met primarily through food by choosing a variety of nutrient-rich foods. What are tips for following this plan? Reading food labels  Read labels and choose the following: ? Reduced or low sodium. ? Juices with 100% fruit juice. ? Foods with low saturated fats and high polyunsaturated and monounsaturated fats. ? Foods with whole grains, such as whole wheat, cracked wheat, brown rice, and wild rice. ? Whole grains that are fortified with folic acid. This is recommended for women who are pregnant or who want to become pregnant.  Read labels and avoid the following: ? Foods with a lot of added sugars. These include foods that contain brown sugar, corn sweetener, corn syrup, dextrose, fructose, glucose, high-fructose corn syrup, honey, invert sugar, lactose, malt syrup, maltose, molasses, raw sugar, sucrose, trehalose, or turbinado sugar.  Do not eat more than the following amounts of added sugar per day:  6 teaspoons (25 g) for women.  9 teaspoons (38 g) for men. ? Foods that contain processed or refined starches and grains. ? Refined grain products, such as white flour, degermed cornmeal, white bread, and white rice. Shopping  Choose nutrient-rich snacks, such as vegetables, whole fruits, and nuts. Avoid high-calorie and high-sugar snacks, such as potato chips, fruit snacks, and candy.  Use oil-based dressings and spreads on foods instead of solid fats such as butter, stick margarine, or cream cheese.  Limit pre-made sauces, mixes, and "instant" products such as flavored rice, instant noodles, and ready-made pasta.  Try more plant-protein sources, such as tofu, tempeh, black beans,  edamame, lentils, nuts, and seeds.  Explore eating plans such as the Mediterranean diet or vegetarian diet. Cooking  Use oil to saut or stir-fry foods instead of solid fats such as butter, stick margarine, or lard.  Try baking, boiling, grilling, or broiling instead of frying.  Remove the fatty part of meats before cooking.  Steam vegetables in water or broth. Meal planning  At meals, imagine dividing your plate into fourths: ? One-half of your plate is fruits and vegetables. ? One-fourth of your plate is whole grains. ? One-fourth of your plate is protein, especially lean meats, poultry, eggs, tofu, beans, or nuts.  Include low-fat dairy as part of your daily diet.   Lifestyle  Choose healthy options in all settings, including home, work, school, restaurants, or stores.  Prepare your food safely: ? Wash your hands after handling raw meats. ? Keep food preparation surfaces clean by regularly washing with hot, soapy water. ? Keep raw meats separate from ready-to-eat foods, such as fruits and vegetables. ? Cook seafood, meat, poultry, and eggs to the recommended internal temperature. ? Store foods at safe temperatures. In general:  Keep cold foods at 7F (4.4C) or below.  Keep hot foods at 17F (60C) or above.  Keep your freezer at [facility] (-17.8C) or below.  Foods are no longer safe to eat when they have been between the temperatures of 40-17F (4.4-60C) for more than 2 hours. What foods should I eat? Fruits Aim to eat 2 cup-equivalents of fresh, canned (in natural juice), or frozen fruits each day. Examples of 1 cup-equivalent of fruit include 1 small apple, 8 large strawberries, 1 cup canned fruit,  cup dried fruit, or 1 cup 100% juice. Vegetables Aim to eat 2-3 cup-equivalents of fresh and frozen vegetables each day, including different varieties and colors. Examples of 1 cup-equivalent of vegetables include 2 medium carrots, 2 cups raw, leafy greens, 1 cup chopped  vegetable (raw or cooked), or 1 medium baked potato. Grains Aim to eat 6 ounce-equivalents of whole grains each day. Examples of 1 ounce-equivalent of grains include 1 slice of bread, 1 cup ready-to-eat cereal, 3 cups popcorn, or  cup cooked rice, pasta, or cereal. Meats and other proteins Aim to eat 5-6 ounce-equivalents of protein each day. Examples of 1 ounce-equivalent of protein include 1 egg, 1/2 cup nuts or seeds, or 1 tablespoon (16 g) peanut butter. A cut of meat or fish that is the size of a deck of cards is about 3-4 ounce-equivalents.  Of the protein you eat each week, try to have at least 8 ounces come from seafood. This includes salmon, trout, herring, and anchovies. Dairy Aim to eat 3 cup-equivalents of fat-free or low-fat dairy each day. Examples of 1 cup-equivalent of dairy include 1 cup (240 mL) milk, 8 ounces (250 g) yogurt, 1 ounces (44 g) natural cheese, or 1 cup (240 mL) fortified soy milk. Fats and oils  Aim for about 5 teaspoons (21 g) per day. Choose monounsaturated fats, such as canola and olive oils, avocados, peanut butter, and most nuts, or polyunsaturated fats, such as sunflower, corn, and soybean oils, walnuts, pine nuts, sesame seeds, sunflower seeds, and flaxseed. Beverages  Aim for six 8-oz glasses of water per day. Limit coffee to three to five 8-oz cups per day.  Limit caffeinated beverages that have added calories, such as soda and energy drinks.  Limit alcohol intake to no more than 1 drink a day for nonpregnant women and 2 drinks a day for men. One drink equals 12 oz of beer (355 mL), 5 oz of wine (148 mL), or 1 oz of hard liquor (44 mL). Seasoning and other foods  Avoid adding excess amounts of salt to your foods. Try flavoring foods with herbs and spices instead of salt.  Avoid adding sugar to foods.  Try using oil-based dressings, sauces, and spreads instead of solid fats. This information is based on general [place] nutrition guidelines. For more  information, visit choosemyplate.gov. Exact amounts may vary based on your nutrition needs. Summary  A healthy eating plan may help you to maintain a healthy weight, reduce the risk of chronic diseases, and stay active throughout your life.  Plan your meals. Make sure you eat the right portions of a variety of nutrient-rich foods.  Try baking, boiling, grilling, or broiling instead of frying.  Choose healthy options in all settings, including home, work, school, restaurants, or stores. This information is not intended to replace advice given to you by your health care provider. Make sure you discuss any questions you have with your health care provider. Document Revised: 05/01/2017 Document Reviewed: 05/01/2017 Elsevier Patient Education  2021 [employer]  

## 2020-03-03 NOTE — Assessment & Plan Note (Signed)
Chronic, stable with minimal use of Albuterol.  Needs no refills today.  Continue to monitor and adjust regimen as needed.

## 2020-03-03 NOTE — Progress Notes (Signed)
BP 134/81   Pulse 81   Temp 98.5 F (36.9 C)   Resp 16   Ht 5' 9.21" (1.758 m)   Wt 163 lb 9.6 oz (74.2 kg)   SpO2 99%   BMI 24.01 kg/m    Subjective:    Patient ID: Steven Rowland, male    DOB: 1975/11/24, 45 y.o.   MRN: [id number]  HPI: Steven Rowland is a 45 y.o. male presenting on 03/03/2020 for comprehensive medical examination. Current medical complaints include:none  He currently lives with: significant other Interim Problems from his last visit: no   ANXIETY/STRESS Continues on occasional Xanax with a prescription lasting 6 months or more.  Last fill 07/30/19 for 30 tablets.  Minimally uses and endorses not having to use as much recently.  Pt is aware of risks of benzo medication use to include increased sedation, respiratory suppression, falls, extrapyramidal movements,  dependence and cardiovascular events.  Pt would like to continue treatment as benefit determined to outweigh risk.   Duration:stable Anxious mood: no  Excessive worrying: no Irritability: no  Sweating: no Nausea: no Palpitations:no Hyperventilation: no Panic attacks: no Agoraphobia: no  Obscessions/compulsions: no Depressed mood: no Depression screen [facility] 2/9 03/03/2020 02/11/2019 08/08/2018 04/19/2017 02/24/2015  Decreased Interest 0 0 0 0 0  Down, Depressed, Hopeless 0 0 0 0 0  PHQ - 2 Score 0 0 0 0 0  Altered sleeping 0 0 0 - -  Tired, decreased energy 0 0 0 - -  Change in appetite 0 0 0 - -  Feeling bad or failure about yourself  0 0 0 - -  Trouble concentrating 0 0 0 - -  Moving slowly or fidgety/restless 0 0 0 - -  Suicidal thoughts 0 0 0 - -  PHQ-9 Score 0 0 0 - -  Difficult doing work/chores - Not difficult at all Not difficult at all - -   Anhedonia: no Weight changes: no Insomnia: yes hard to fall asleep  Hypersomnia: no Fatigue/loss of energy: no Feelings of worthlessness: no Feelings of guilt: no Impaired concentration/indecisiveness: no Suicidal ideations: no  Crying spells:  no Recent Stressors/Life Changes: no   Relationship problems: no   Family stress: no     Financial stress: no    Job stress: no    Recent death/loss: no GAD 7 : Generalized Anxiety Score 02/11/2019 08/08/2018  Nervous, Anxious, on Edge 0 0  Control/stop worrying 0 0  Worry too much - different things 0 0  Trouble relaxing 0 1  Restless 0 0  Easily annoyed or irritable 0 0  Afraid - awful might happen 0 0  Total GAD 7 Score 0 1  Anxiety Difficulty Not difficult at all Not difficult at all   ASTHMA On Albuterol as needed -- has not needed in years. Asthma status: stable Satisfied with current treatment?: yes Albuterol/rescue inhaler frequency:  Dyspnea frequency:  Wheezing frequency: Cough frequency:  Nocturnal symptom frequency:  Limitation of activity: no Current upper respiratory symptoms: no Triggers:  Home peak flows: Last Spirometry:  Failed/intolerant to following asthma meds:  Asthma meds in past:  Aerochamber/spacer use: no Visits to ER or Urgent Care in past year: no Pneumovax: unknown Influenza: Up to Date  FALL RISK: Fall Risk  03/03/2020 08/08/2018 04/19/2017 02/24/2015  Falls in the past year? 0 0 No No  Number falls in past yr: - 0 - -  Injury with Fall? - 0 - -  Follow up - Falls evaluation completed - -  Depression Screen Depression screen [facility] 2/9 03/03/2020 02/11/2019 08/08/2018 04/19/2017 02/24/2015  Decreased Interest 0 0 0 0 0  Down, Depressed, Hopeless 0 0 0 0 0  PHQ - 2 Score 0 0 0 0 0  Altered sleeping 0 0 0 - -  Tired, decreased energy 0 0 0 - -  Change in appetite 0 0 0 - -  Feeling bad or failure about yourself  0 0 0 - -  Trouble concentrating 0 0 0 - -  Moving slowly or fidgety/restless 0 0 0 - -  Suicidal thoughts 0 0 0 - -  PHQ-9 Score 0 0 0 - -  Difficult doing work/chores - Not difficult at all Not difficult at all - -    Advanced Directives <no information>  Past Medical History:  Past Medical History:  Diagnosis Date  . Asthma    . ED (erectile dysfunction)   . Hypogonadism in male     Surgical History:  Past Surgical History:  Procedure Laterality Date  . SURGERY SCROTAL / TESTICULAR  1982   undescended testicle    Medications:  Current Outpatient Medications on File Prior to Visit  Medication Sig  . albuterol (PROVENTIL HFA;VENTOLIN HFA) 108 (90 Base) MCG/ACT inhaler Inhale 2 puffs into the lungs every 4 (four) hours as needed.   No current facility-administered medications on file prior to visit.    Allergies:  No Known Allergies  Social History:  Social History   Socioeconomic History  . Marital status: Married    Spouse name: Not on file  . Number of children: Not on file  . Years of education: Not on file  . Highest education level: Not on file  Occupational History  . Not on file  Tobacco Use  . Smoking status: Never Smoker  . Smokeless tobacco: Never Used  Substance and Sexual Activity  . Alcohol use: Yes    Alcohol/week: 1.0 - 2.0 standard drink    Types: 1 - 2 Cans of beer per week  . Drug use: No  . Sexual activity: Yes  Other Topics Concern  . Not on file  Social History Narrative  . Not on file   Social Determinants of Health   Financial Resource Strain: Not on file  Food Insecurity: Not on file  Transportation Needs: Not on file  Physical Activity: Not on file  Stress: Not on file  Social Connections: Not on file  Intimate Partner Violence: Not on file   Social History   Tobacco Use  Smoking Status Never Smoker  Smokeless Tobacco Never Used   Social History   Substance and Sexual Activity  Alcohol Use Yes  . Alcohol/week: 1.0 - 2.0 standard drink  . Types: 1 - 2 Cans of beer per week    Family History:  Family History  Problem Relation Age of Onset  . Arthritis Mother   . Stroke Father   . Hyperlipidemia Father   . Hypertension Father   . Kidney Stones Father   . Asthma Brother   . Thyroid disease Brother   . Eczema Son   . Cancer Maternal  Grandmother   . Cancer Maternal Grandfather   . Kidney disease Neg Hx   . Prostate cancer Neg Hx     Past medical history, surgical history, medications, allergies, family history and social history reviewed with patient today and changes made to appropriate areas of the chart.   Review of Systems - negative All other ROS negative except what is listed  above and in the HPI.      Objective:    BP 134/81   Pulse 81   Temp 98.5 F (36.9 C)   Resp 16   Ht 5' 9.21" (1.758 m)   Wt 163 lb 9.6 oz (74.2 kg)   SpO2 99%   BMI 24.01 kg/m   Wt Readings from Last 3 Encounters:  03/03/20 163 lb 9.6 oz (74.2 kg)  05/29/19 159 lb (72.1 kg)  02/11/19 170 lb 9.6 oz (77.4 kg)    Physical Exam Vitals and nursing note reviewed.  Constitutional:      General: He is awake. He is not in acute distress.    Appearance: He is well-developed. He is not ill-appearing.  HENT:     Head: Normocephalic and atraumatic.     Right Ear: Hearing, tympanic membrane, ear canal and external ear normal. No drainage.     Left Ear: Hearing, tympanic membrane, ear canal and external ear normal. No drainage.     Nose: Nose normal.     Mouth/Throat:     Pharynx: Uvula midline.  Eyes:     General: Lids are normal.        Right eye: No discharge.        Left eye: No discharge.     Extraocular Movements: Extraocular movements intact.     Conjunctiva/sclera: Conjunctivae normal.     Pupils: Pupils are equal, round, and reactive to light.     Visual Fields: Right eye visual fields normal and left eye visual fields normal.  Neck:     Thyroid: No thyromegaly.     Vascular: No carotid bruit or JVD.     Trachea: Trachea normal.  Cardiovascular:     Rate and Rhythm: Normal rate and regular rhythm.     Heart sounds: Normal heart sounds, S1 normal and S2 normal. No murmur heard. No gallop.   Pulmonary:     Effort: Pulmonary effort is normal. No accessory muscle usage or respiratory distress.     Breath sounds: Normal  breath sounds.  Abdominal:     General: Bowel sounds are normal.     Palpations: Abdomen is soft. There is no hepatomegaly or splenomegaly.     Tenderness: There is no abdominal tenderness.  Genitourinary:    Comments: Deferred per patient request  Musculoskeletal:        General: Normal range of motion.     Cervical back: Normal range of motion and neck supple.     Right lower leg: No edema.     Left lower leg: No edema.  Lymphadenopathy:     Head:     Right side of head: No submental, submandibular, tonsillar, preauricular or posterior auricular adenopathy.     Left side of head: No submental, submandibular, tonsillar, preauricular or posterior auricular adenopathy.     Cervical: No cervical adenopathy.  Skin:    General: Skin is warm and dry.     Capillary Refill: Capillary refill takes less than 2 seconds.     Findings: No rash.  Neurological:     Mental Status: He is alert and oriented to person, place, and time.     Cranial Nerves: Cranial nerves are intact.     Gait: Gait is intact.     Deep Tendon Reflexes: Reflexes are normal and symmetric.     Reflex Scores:      Brachioradialis reflexes are 2+ on the right side and 2+ on the left side.  Patellar reflexes are 2+ on the right side and 2+ on the left side. Psychiatric:        Attention and Perception: Attention normal.        Mood and Affect: Mood normal.        Speech: Speech normal.        Behavior: Behavior normal. Behavior is cooperative.        Thought Content: Thought content normal.        Cognition and Memory: Cognition normal.        Judgment: Judgment normal.     Results for orders placed or performed in visit on 02/11/19  CBC with Differential  Result Value Ref Range   WBC 4.1 3.4 - 10.8 x10E3/uL   RBC 5.07 4.14 - 5.80 x10E6/uL   Hemoglobin 15.1 13.0 - 17.7 g/dL   Hematocrit 45.6 37.5 - 51.0 %   MCV 90 79 - 97 fL   MCH 29.8 26.6 - 33.0 pg   MCHC 33.1 31.5 - 35.7 g/dL   RDW 11.9 11.6 - 15.4 %    Platelets 212 150 - 450 x10E3/uL   Neutrophils 47 Not Estab. %   Lymphs 37 Not Estab. %   Monocytes 11 Not Estab. %   Eos 4 Not Estab. %   Basos 1 Not Estab. %   Neutrophils Absolute 2.0 1.4 - 7.0 x10E3/uL   Lymphocytes Absolute 1.5 0.7 - 3.1 x10E3/uL   Monocytes Absolute 0.5 0.1 - 0.9 x10E3/uL   EOS (ABSOLUTE) 0.2 0.0 - 0.4 x10E3/uL   Basophils Absolute 0.0 0.0 - 0.2 x10E3/uL   Immature Granulocytes 0 Not Estab. %   Immature Grans (Abs) 0.0 0.0 - 0.1 x10E3/uL  Comprehensive metabolic panel  Result Value Ref Range   Glucose 83 65 - 99 mg/dL   BUN 22 6 - 24 mg/dL   Creatinine, Ser 1.02 0.76 - 1.27 mg/dL   GFR calc non Af Amer 90 >59 mL/min/1.73   GFR calc Af Amer 104 >59 mL/min/1.73   BUN/Creatinine Ratio 22 (H) 9 - 20   Sodium 139 134 - 144 mmol/L   Potassium 4.3 3.5 - 5.2 mmol/L   Chloride 102 96 - 106 mmol/L   CO2 21 20 - 29 mmol/L   Calcium 9.1 8.7 - 10.2 mg/dL   Total Protein 6.9 6.0 - 8.5 g/dL   Albumin 4.6 4.0 - 5.0 g/dL   Globulin, Total 2.3 1.5 - 4.5 g/dL   Albumin/Globulin Ratio 2.0 1.2 - 2.2   Bilirubin Total 0.6 0.0 - 1.2 mg/dL   Alkaline Phosphatase 56 39 - 117 IU/L   AST 23 0 - 40 IU/L   ALT 29 0 - 44 IU/L  Lipid panel  Result Value Ref Range   Cholesterol, Total 179 100 - 199 mg/dL   Triglycerides 74 0 - 149 mg/dL   HDL 59 >39 mg/dL   VLDL Cholesterol Cal 14 5 - 40 mg/dL   LDL Chol Calc (NIH) 106 (H) 0 - 99 mg/dL   Chol/HDL Ratio 3.0 0.0 - 5.0 ratio  TSH  Result Value Ref Range   TSH 2.880 0.450 - 4.500 uIU/mL  PSA  Result Value Ref Range   Prostate Specific Ag, Serum 0.4 0.0 - 4.0 ng/mL  Urine drugs of abuse scrn w alc, routine (Ref Lab)  Result Value Ref Range   Amphetamines, Urine Negative Cutoff=1000 ng/mL   Barbiturate Quant, Ur Negative Cutoff=300 ng/mL   Benzodiazepine Quant, Ur Negative Cutoff=300 ng/mL   Cannabinoid Quant, Ur Negative  Cutoff=50 ng/mL   Cocaine (Metab.) Negative Cutoff=300 ng/mL   Opiate Quant, Ur Negative Cutoff=300  ng/mL   PCP Quant, Ur Negative Cutoff=25 ng/mL   Methadone Screen, Urine Negative Cutoff=300 ng/mL   Propoxyphene Negative Cutoff=300 ng/mL   Ethanol, Urine Negative Cutoff=0.020 %      Assessment & Plan:   Problem List Items Addressed This Visit      Respiratory   Exercise-induced asthma - Primary    Chronic, stable with minimal use of Albuterol.  Needs no refills today.  Continue to monitor and adjust regimen as needed.        Other   ED (erectile dysfunction)    Chronic, ongoing.  Continue current medication regimen and adjust as needed.  Refills sent.      GAD (generalized anxiety disorder)    Chronic, stable.  Minimally uses Xanax.  Continue current medication regimen and recommend reduction and switch to SSRI if ongoing issues.  Return in 6 months.  Refills sent.  PDMP reviewed.      Relevant Medications   ALPRAZolam (XANAX) 0.5 MG tablet   Other Relevant Orders   [id number]+Oxyco+Alc+Crt-Bund    Other Visit Diagnoses    Annual physical exam       Annual labs today to include CBC, CMP, TSH, PSA, lipid   Relevant Orders   Hepatitis C antibody, reflex   PSA   TSH   Lipid Profile   Comp Met (CMET)   CBC with Differential      Discussed aspirin prophylaxis for myocardial infarction prevention and decision was it was not indicated  LABORATORY TESTING:  Health maintenance labs ordered today as discussed above.   The natural history of prostate cancer and ongoing controversy regarding screening and potential treatment outcomes of prostate cancer has been discussed with the patient. The meaning of a false positive PSA and a false negative PSA has been discussed. He indicates understanding of the limitations of this screening test and wishes to proceed with screening PSA testing.   IMMUNIZATIONS:   - Tdap: Tetanus vaccination status reviewed: last tetanus booster within 10 years. - Influenza: Up to date - Pneumovax: Not applicable - Prevnar: Not applicable -  Zostavax vaccine: not applicable - Covid vaccines -- up to date  SCREENING: - Colonoscopy: Not applicable -- 2 more years Discussed with patient purpose of the colonoscopy is to detect colon cancer at curable precancerous or early stages   - AAA Screening: Not applicable  -Hearing Test: Not applicable  -Spirometry: Not applicable   PATIENT COUNSELING:    Sexuality: Discussed sexually transmitted diseases, partner selection, use of condoms, avoidance of unintended pregnancy  and contraceptive alternatives.   Advised to avoid cigarette smoking.  I discussed with the patient that most people either abstain from alcohol or drink within safe limits (<=14/week and <=4 drinks/occasion for males, <=7/weeks and <= 3 drinks/occasion for females) and that the risk for alcohol disorders and other health effects rises proportionally with the number of drinks per week and how often a drinker exceeds daily limits.  Discussed cessation/primary prevention of drug use and availability of treatment for abuse.   Diet: Encouraged to adjust caloric intake to maintain  or achieve ideal body weight, to reduce intake of dietary saturated fat and total fat, to limit sodium intake by avoiding high sodium foods and not adding table salt, and to maintain adequate dietary potassium and calcium preferably from fresh fruits, vegetables, and low-fat dairy products.    Stressed the importance of regular  exercise  Injury prevention: Discussed safety belts, safety helmets, smoke detector, smoking near bedding or upholstery.   Dental health: Discussed importance of regular tooth brushing, flossing, and dental visits.   Follow up plan: NEXT PREVENTATIVE PHYSICAL DUE IN 1 YEAR. Return in about 1 year (around 03/03/2021) for Annual physical.

## 2020-03-03 NOTE — Assessment & Plan Note (Signed)
Chronic, stable.  Minimally uses Xanax.  Continue current medication regimen and recommend reduction and switch to SSRI if ongoing issues.  Return in 6 months.  Refills sent.  PDMP reviewed.

## 2020-03-04 LAB — CBC WITH DIFFERENTIAL/PLATELET
Basophils Absolute: 0 10*3/uL (ref 0.0–0.2)
Basos: 1 %
EOS (ABSOLUTE): 0.1 10*3/uL (ref 0.0–0.4)
Eos: 2 %
Hematocrit: 41.8 % (ref 37.5–51.0)
Hemoglobin: 14.3 g/dL (ref 13.0–17.7)
Immature Grans (Abs): 0 10*3/uL (ref 0.0–0.1)
Immature Granulocytes: 0 %
Lymphocytes Absolute: 1.6 10*3/uL (ref 0.7–3.1)
Lymphs: 31 %
MCH: 29.9 pg (ref 26.6–33.0)
MCHC: 34.2 g/dL (ref 31.5–35.7)
MCV: 87 fL (ref 79–97)
Monocytes Absolute: 0.5 10*3/uL (ref 0.1–0.9)
Monocytes: 10 %
Neutrophils Absolute: 2.8 10*3/uL (ref 1.4–7.0)
Neutrophils: 56 %
Platelets: 195 10*3/uL (ref 150–450)
RBC: 4.79 x10E6/uL (ref 4.14–5.80)
RDW: 12.1 % (ref 11.6–15.4)
WBC: 5.1 10*3/uL (ref 3.4–10.8)

## 2020-03-04 LAB — PSA: Prostate Specific Ag, Serum: 0.3 ng/mL (ref 0.0–4.0)

## 2020-03-04 LAB — COMPREHENSIVE METABOLIC PANEL
ALT: 16 IU/L (ref 0–44)
AST: 23 IU/L (ref 0–40)
Albumin/Globulin Ratio: 2 (ref 1.2–2.2)
Albumin: 4.4 g/dL (ref 4.0–5.0)
Alkaline Phosphatase: 53 IU/L (ref 44–121)
BUN/Creatinine Ratio: 26 — ABNORMAL HIGH (ref 9–20)
BUN: 25 mg/dL — ABNORMAL HIGH (ref 6–24)
Bilirubin Total: 0.4 mg/dL (ref 0.0–1.2)
CO2: 23 mmol/L (ref 20–29)
Calcium: 9.3 mg/dL (ref 8.7–10.2)
Chloride: 104 mmol/L (ref 96–106)
Creatinine, Ser: 0.98 mg/dL (ref 0.76–1.27)
GFR calc Af Amer: 109 mL/min/{1.73_m2} (ref >59)
GFR calc non Af Amer: 94 mL/min/{1.73_m2} (ref >59)
Globulin, Total: 2.2 g/dL (ref 1.5–4.5)
Glucose: 92 mg/dL (ref 65–99)
Potassium: 4.3 mmol/L (ref 3.5–5.2)
Sodium: 139 mmol/L (ref 134–144)
Total Protein: 6.6 g/dL (ref 6.0–8.5)

## 2020-03-04 LAB — LIPID PANEL
Chol/HDL Ratio: 3.1 ratio (ref 0.0–5.0)
Cholesterol, Total: 155 mg/dL (ref 100–199)
HDL: 50 mg/dL (ref >39)
LDL Chol Calc (NIH): 85 mg/dL (ref 0–99)
Triglycerides: 109 mg/dL (ref 0–149)
VLDL Cholesterol Cal: 20 mg/dL (ref 5–40)

## 2020-03-04 LAB — TSH: TSH: 1.89 u[IU]/mL (ref 0.450–4.500)

## 2020-03-04 NOTE — Progress Notes (Signed)
Contacted via [place] afternoon Emric, current labs that have returned all look great!!  No concerns noted.  Any questions? Keep being awesome!!  Thank you for allowing me to participate in your care. Kindest regards, Murat Rideout

## 2020-03-05 LAB — DRUG SCREEN 764883 11+OXYCO+ALC+CRT-BUND
Amphetamines, Urine: NEGATIVE ng/mL
BENZODIAZ UR QL: NEGATIVE ng/mL
Barbiturate: NEGATIVE ng/mL
Cannabinoid Quant, Ur: NEGATIVE ng/mL
Cocaine (Metabolite): NEGATIVE ng/mL
Creatinine: 44.7 mg/dL (ref 20.0–300.0)
Ethanol: NEGATIVE %
Meperidine: NEGATIVE ng/mL
Methadone Screen, Urine: NEGATIVE ng/mL
OPIATE SCREEN URINE: NEGATIVE ng/mL
Oxycodone/Oxymorphone, Urine: NEGATIVE ng/mL
Phencyclidine: NEGATIVE ng/mL
Propoxyphene: NEGATIVE ng/mL
Tramadol: NEGATIVE ng/mL
pH, Urine: 5.9 (ref 4.5–8.9)

## 2020-05-02 ENCOUNTER — Other Ambulatory Visit: Payer: Self-pay

## 2020-05-02 MED FILL — Alprazolam Tab 0.5 MG: ORAL | 30 days supply | Qty: 30 | Fill #0 | Status: AC

## 2020-05-23 ENCOUNTER — Other Ambulatory Visit: Payer: Self-pay | Admitting: Nurse Practitioner

## 2020-05-23 NOTE — Telephone Encounter (Signed)
Requested Prescriptions  Pending Prescriptions Disp Refills  . sildenafil (REVATIO) 20 MG tablet [Pharmacy Med Name: SILDENAFIL 20 MG TABLET] 90 tablet 0    Sig: TAKE 1 TO 5 TABLETS BY MOUTH DAILY IF NEEDED     Urology: Erectile Dysfunction Agents Passed - 05/23/2020  3:39 PM      Passed - Last BP in normal range    BP Readings from Last 1 Encounters:  03/03/20 134/81         Passed - Valid encounter within last 12 months    Recent Outpatient Visits          2 months ago Exercise-induced asthma   [place], Henrine Screws T, NP   1 year ago Annual physical exam   [place], Henrine Screws T, NP   1 year ago GAD (generalized anxiety disorder)   [place], Henrine Screws T, NP   2 years ago Routine general medical examination at a health care facility   [facility] Kathrine Haddock, NP   3 years ago Routine general medical examination at a health care facility   [place], NP

## 2020-06-24 ENCOUNTER — Other Ambulatory Visit: Payer: Self-pay

## 2020-06-24 MED FILL — Alprazolam Tab 0.5 MG: ORAL | 30 days supply | Qty: 30 | Fill #1 | Status: AC

## 2020-07-06 ENCOUNTER — Other Ambulatory Visit: Payer: Self-pay | Admitting: Nurse Practitioner

## 2020-08-08 DIAGNOSIS — Z20822 Contact with and (suspected) exposure to covid-19: Secondary | ICD-10-CM | POA: Diagnosis not present

## 2020-09-18 ENCOUNTER — Other Ambulatory Visit: Payer: Self-pay | Admitting: Nurse Practitioner

## 2020-10-26 ENCOUNTER — Other Ambulatory Visit: Payer: Self-pay

## 2020-11-21 ENCOUNTER — Other Ambulatory Visit: Payer: Self-pay | Admitting: Nurse Practitioner

## 2020-11-21 ENCOUNTER — Other Ambulatory Visit: Payer: Self-pay

## 2020-11-22 NOTE — Telephone Encounter (Signed)
Requested Prescriptions  Pending Prescriptions Disp Refills  . sildenafil (REVATIO) 20 MG tablet [Pharmacy Med Name: SILDENAFIL 20 MG TABLET] 90 tablet 0    Sig: TAKE 1-5 TABLETS BY MOUTH DAILY AS NEEDED     Urology: Erectile Dysfunction Agents Passed - 11/21/2020  1:40 PM      Passed - Last BP in normal range    BP Readings from Last 1 Encounters:  03/03/20 134/81         Passed - Valid encounter within last 12 months    Recent Outpatient Visits          8 months ago Exercise-induced asthma   [place], Henrine Screws T, NP   1 year ago Annual physical exam   [place], Henrine Screws T, NP   2 years ago GAD (generalized anxiety disorder)   [place], Henrine Screws T, NP   2 years ago Routine general medical examination at a health care facility   [facility] Kathrine Haddock, NP   3 years ago Routine general medical examination at a health care facility   [place], NP

## 2021-01-12 IMAGING — DX RIGHT HAND - COMPLETE 3+ VIEW
3 series · 3 of 3 positions shown · non-contrast
Comparison: None.

CLINICAL DATA: Status post foreign body removal

EXAM:
RIGHT HAND - COMPLETE 3+ VIEW

[hand ap]
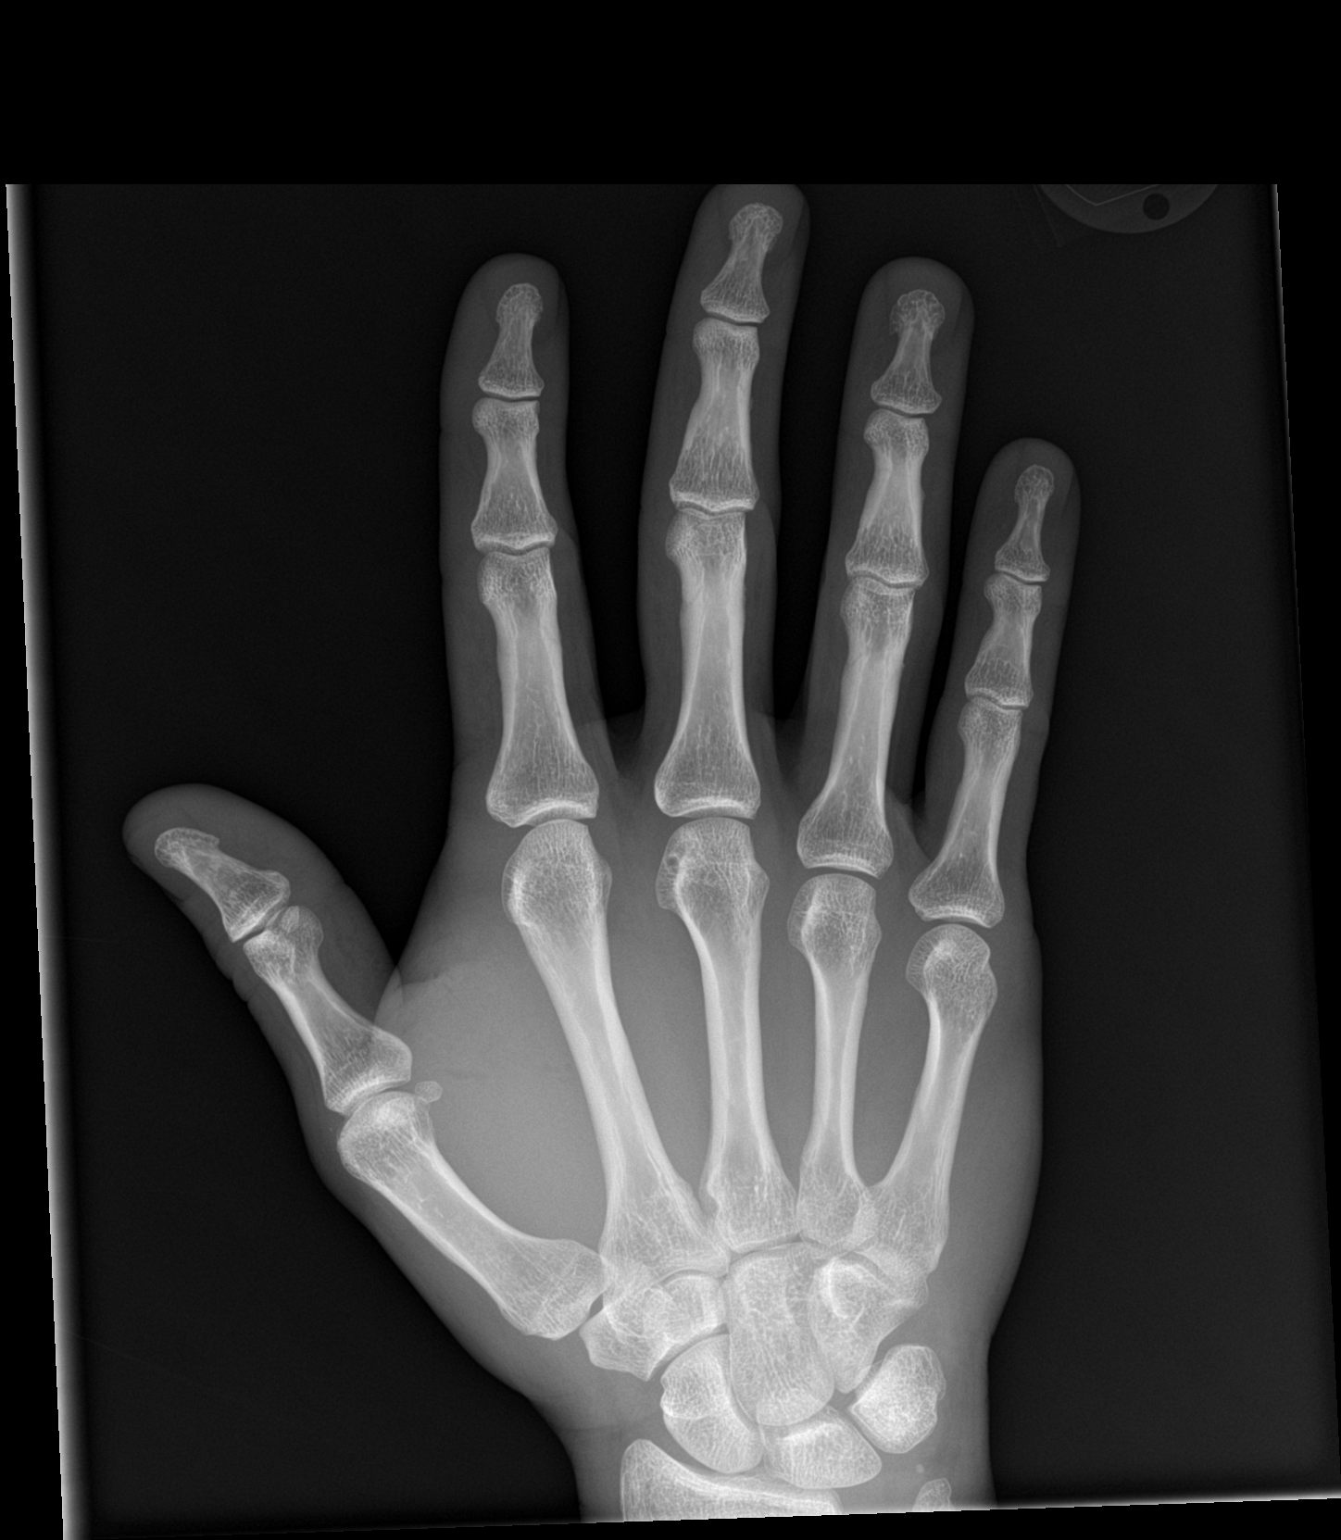

[hand obl]
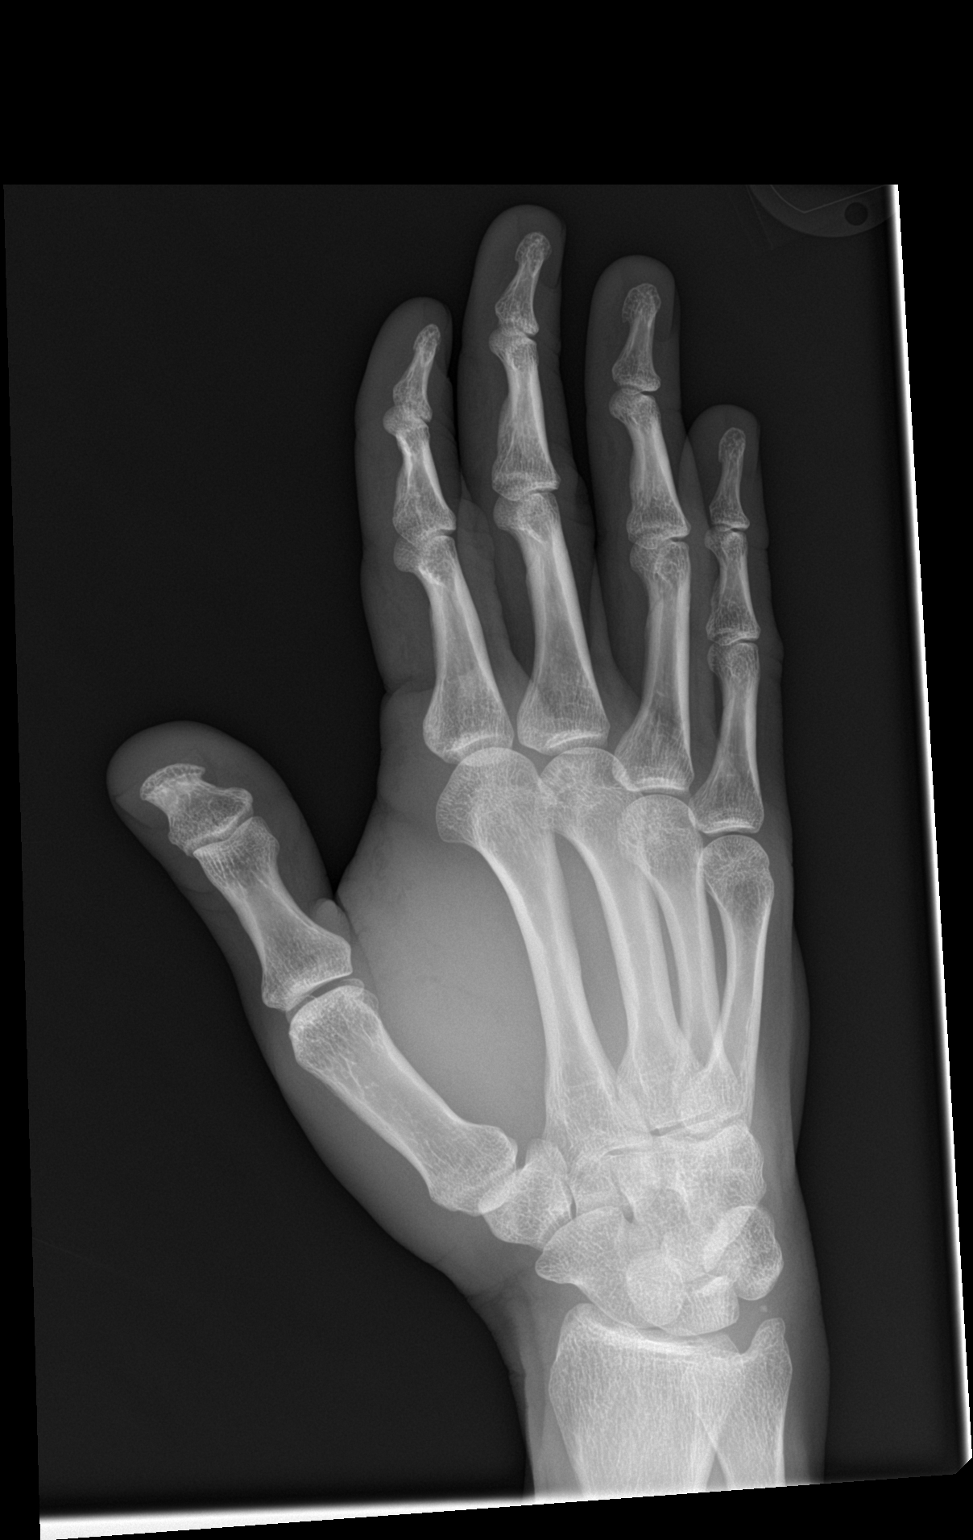

[hand lat]
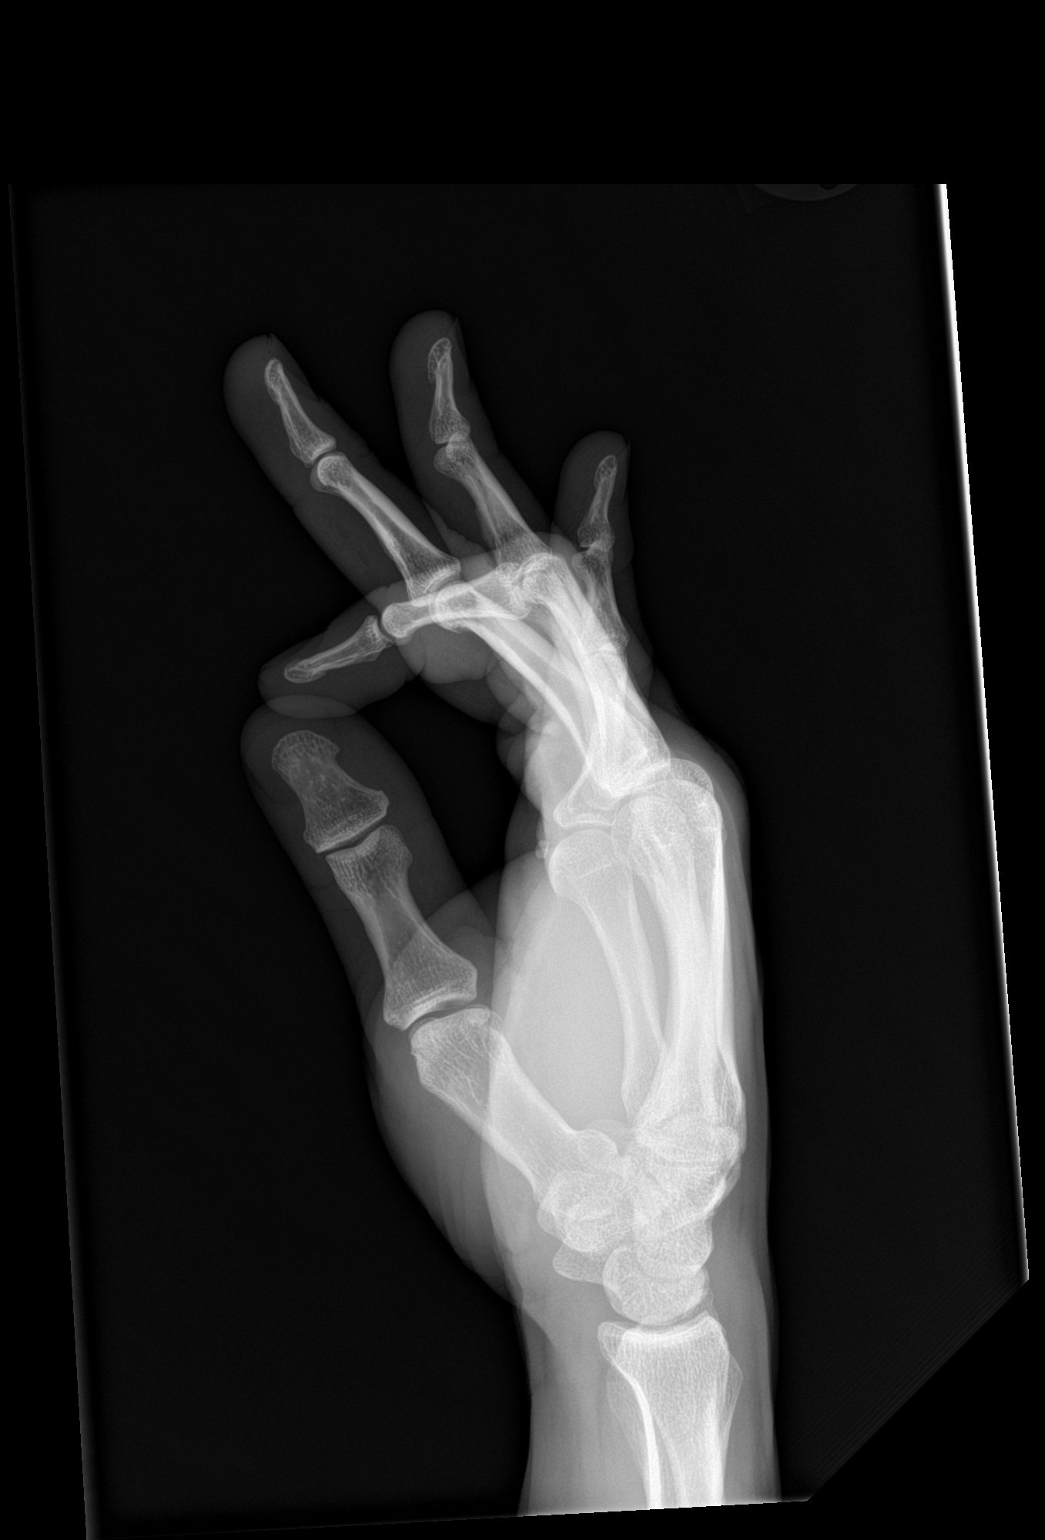

[3 of 3 positions shown; findings below may reference images not displayed]

FINDINGS: The right hand demonstrates no fracture or dislocation. Mild soft
tissue emphysema along the thenar eminence of the right hand likely
at the site of foreign body injury with subsequent removal. No
radiopaque foreign body.
IMPRESSION: No radiopaque foreign body. Typically, would is radiolucent and may
not be apparent on plain radiography. If there is further clinical
concern regarding retained would not foreign body recommend an
ultrasound of the right hand.

## 2021-01-12 IMAGING — US RIGHT UPPER EXTREMITY SOFT TISSUE ULTRASOUND LIMITED
1 series · 14 of 25 positions shown · non-contrast
Comparison: Right hand x-rays from same day.

CLINICAL DATA: Splinter.  Evaluate for retained foreign body.

EXAM:
ULTRASOUND RIGHT UPPER EXTREMITY LIMITED
TECHNIQUE: Ultrasound examination of the upper extremity soft tissues was
performed in the area of clinical concern.

[Series 1: right upper extremity soft tissue ultrasound limit · 46 acquisitions, 14 frames shown]
[im 1/46]
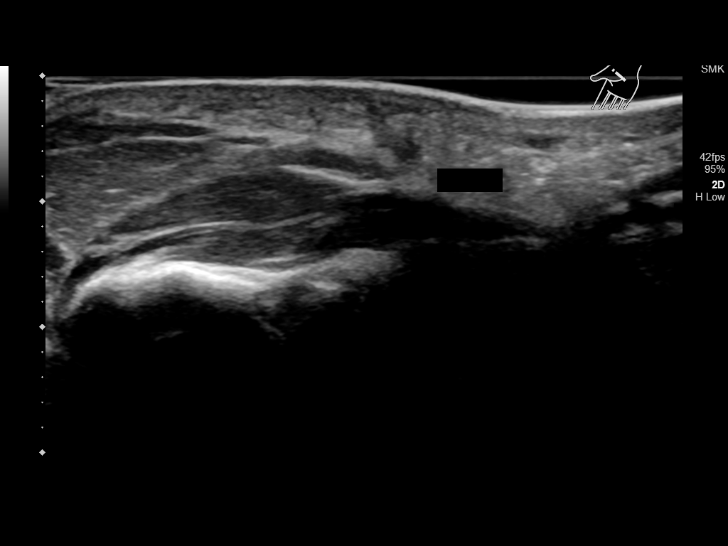
[im 4/46]
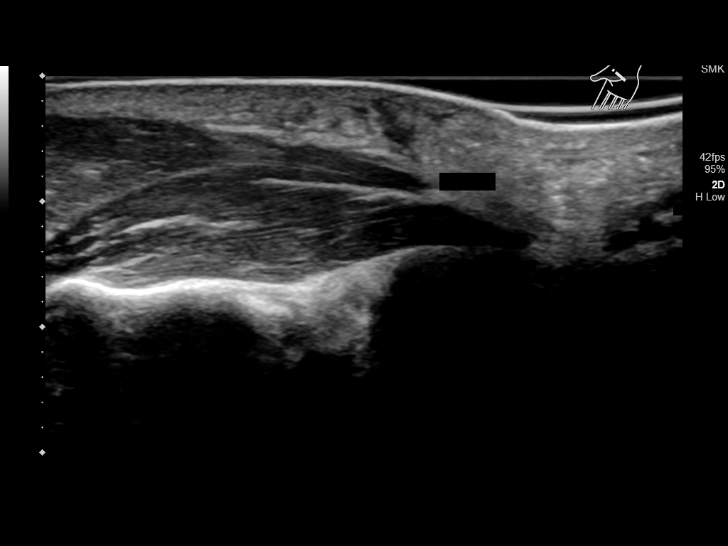
[im 8/46]
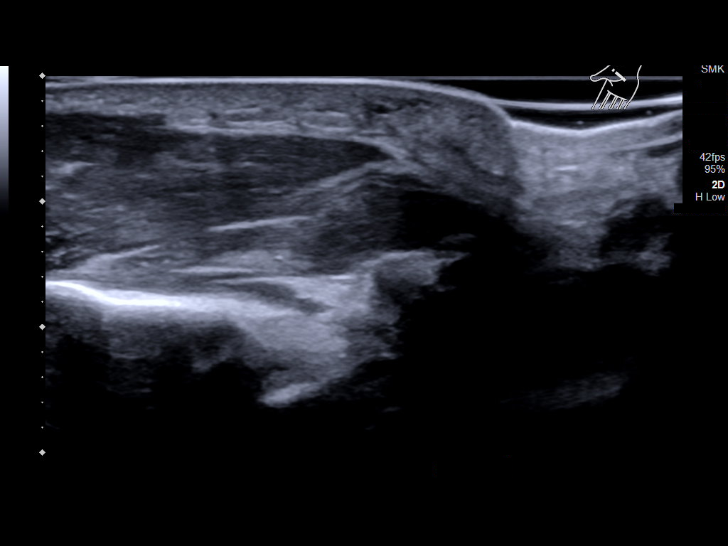
[im 12/46]
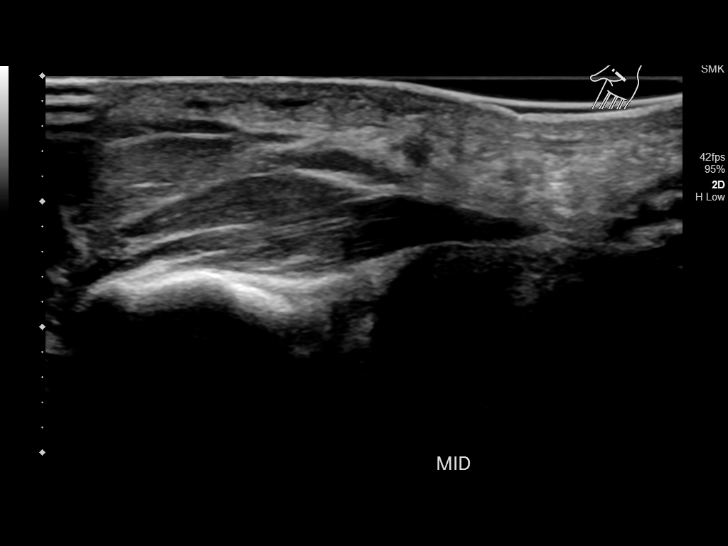
[im 16/46]
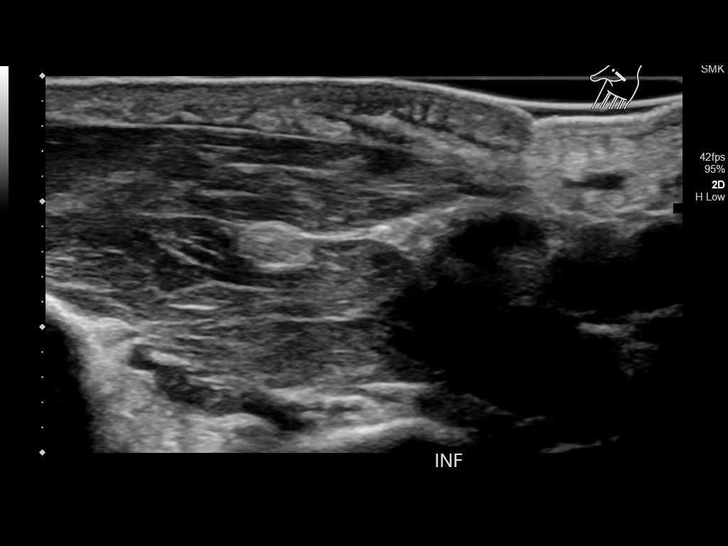
[im 17/46]
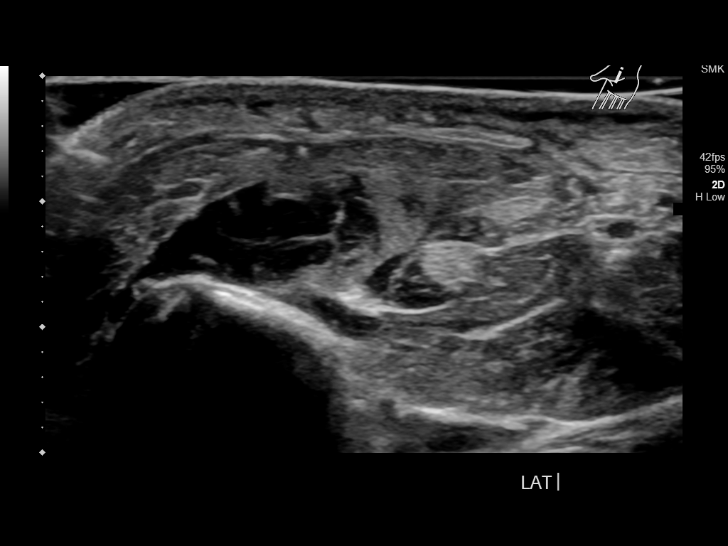
[im 21/46]
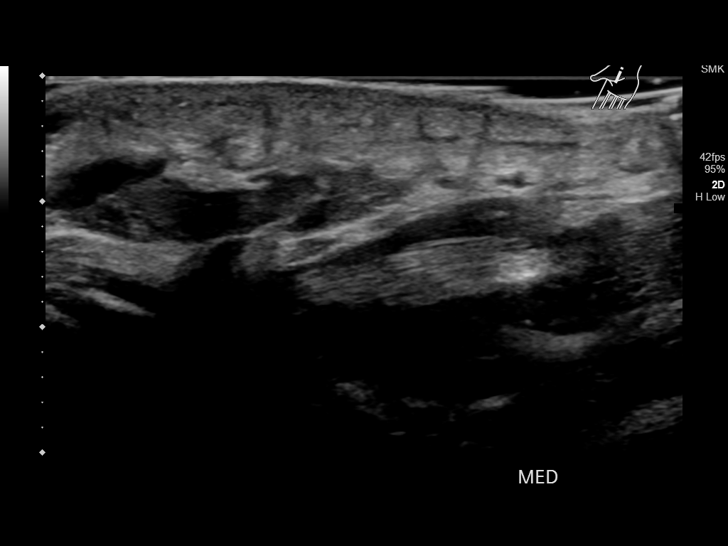
[im 25/46]
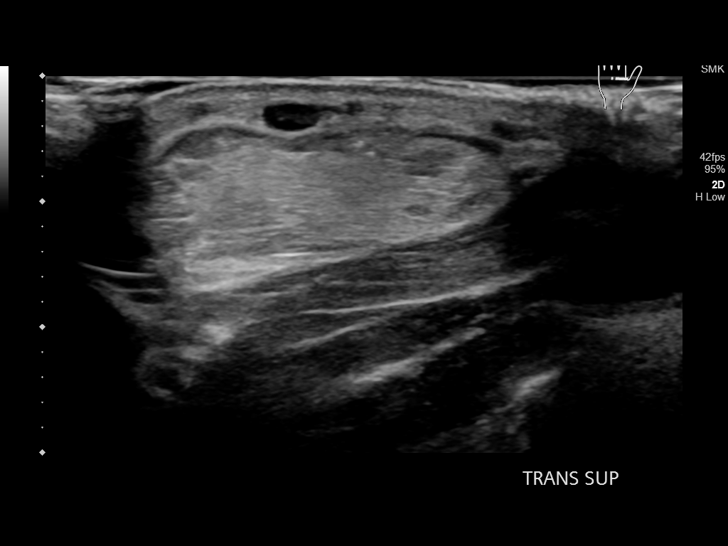
[im 29/46]
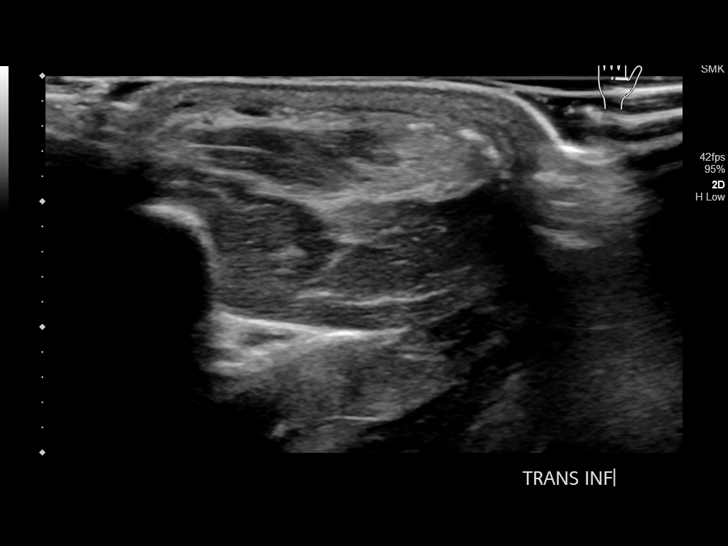
[im 31/46]
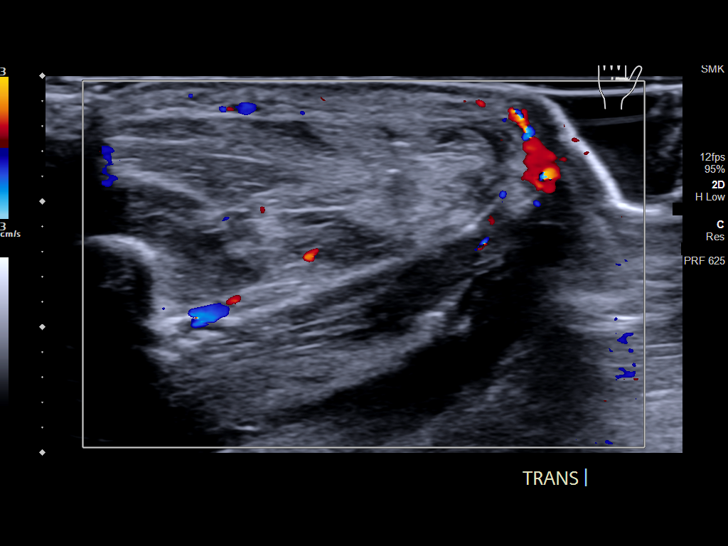
[im 34/46]
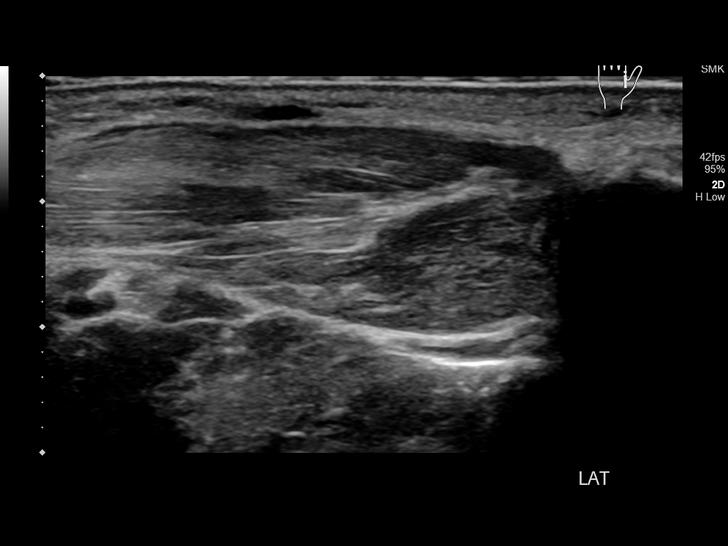
[im 38/46]
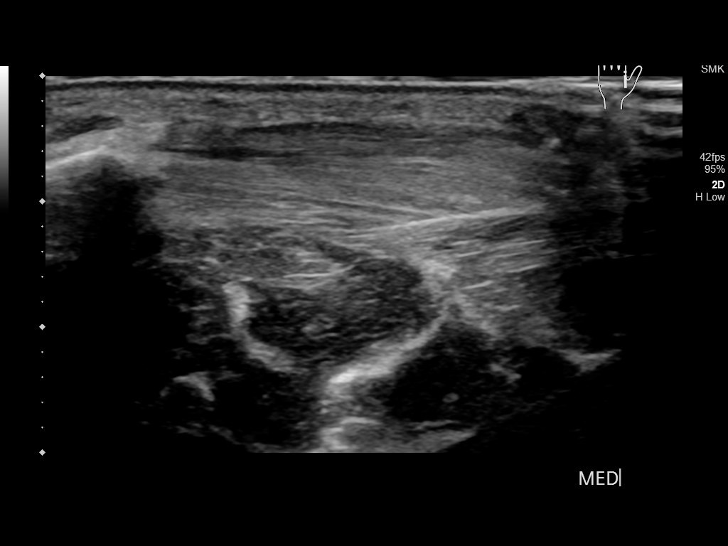
[im 42/46]
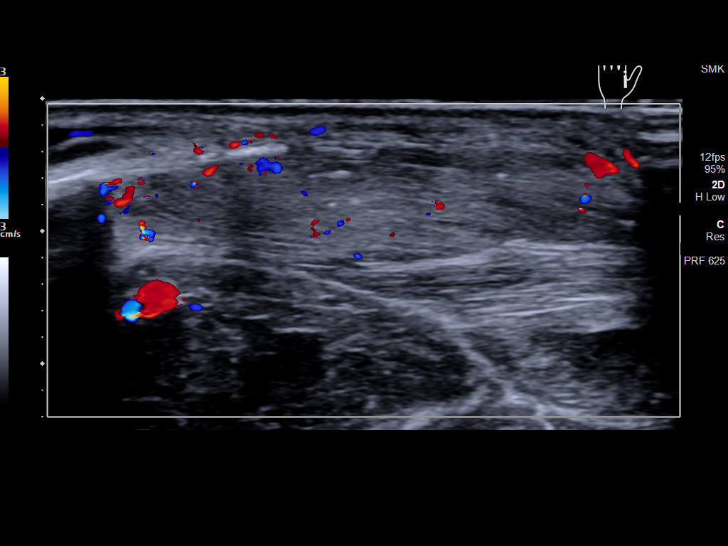
[im 46/46]
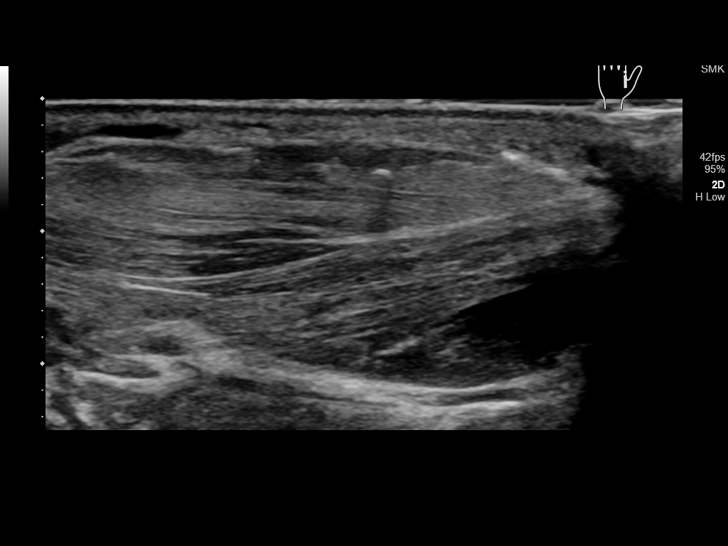

[14 of 25 positions shown; findings below may reference images not displayed]

FINDINGS: Focused ultrasound along the palmar aspect of the thenar eminence
near the splinter entry site demonstrates a small subcutaneous tract
without evidence of hyperechoic foreign body.

Focused ultrasound along the dorsal aspect of the thenar eminence
demonstrates multiple, at least 20, intramuscular, hyperechoic foci.
Two of these foci are somewhat linear in appearance, measuring 4-5
mm (best seen on the last set of cine images). There is mild
hyperemia.

No fluid collection or soft tissue mass.
IMPRESSION: Multiple intramuscular, hyperechoic foci within the thenar eminence
likely represent soft tissue emphysema. However, two of these foci
are somewhat linear in appearance, measuring 4-5 mm, and could
potentially represent tiny residual [employer] fragments.

## 2021-01-22 ENCOUNTER — Other Ambulatory Visit: Payer: Self-pay | Admitting: Nurse Practitioner

## 2021-01-22 NOTE — Telephone Encounter (Signed)
Requested Prescriptions  Pending Prescriptions Disp Refills   sildenafil (REVATIO) 20 MG tablet [Pharmacy Med Name: SILDENAFIL 20 MG TABLET] 90 tablet 0    Sig: TAKE 1-5 TABLETS BY MOUTH DAILY AS NEEDED     Urology: Erectile Dysfunction Agents Passed - 01/22/2021  3:52 PM      Passed - Last BP in normal range    BP Readings from Last 1 Encounters:  03/03/20 134/81         Passed - Valid encounter within last 12 months    Recent Outpatient Visits          10 months ago Exercise-induced asthma   [place], Henrine Screws T, NP   1 year ago Annual physical exam   [place], Henrine Screws T, NP   2 years ago GAD (generalized anxiety disorder)   [place], Henrine Screws T, NP   2 years ago Routine general medical examination at a health care facility   [facility] Kathrine Haddock, NP   3 years ago Routine general medical examination at a health care facility   [place], NP
